# Patient Record
Sex: Male | Born: 1935 | Race: White | Hispanic: No | Smoking: Former smoker
Health system: Southern US, Community
[De-identification: ages and names within clinical notes are randomized; demographics above are authoritative.]

## PROBLEM LIST (undated history)

## (undated) DIAGNOSIS — K635 Polyp of colon: Secondary | ICD-10-CM

## (undated) DIAGNOSIS — M1990 Unspecified osteoarthritis, unspecified site: Secondary | ICD-10-CM

## (undated) DIAGNOSIS — Z87891 Personal history of nicotine dependence: Secondary | ICD-10-CM

## (undated) DIAGNOSIS — J449 Chronic obstructive pulmonary disease, unspecified: Secondary | ICD-10-CM

## (undated) DIAGNOSIS — L219 Seborrheic dermatitis, unspecified: Secondary | ICD-10-CM

## (undated) DIAGNOSIS — N400 Enlarged prostate without lower urinary tract symptoms: Secondary | ICD-10-CM

## (undated) DIAGNOSIS — D696 Thrombocytopenia, unspecified: Secondary | ICD-10-CM

## (undated) DIAGNOSIS — H269 Unspecified cataract: Secondary | ICD-10-CM

## (undated) DIAGNOSIS — C679 Malignant neoplasm of bladder, unspecified: Secondary | ICD-10-CM

## (undated) DIAGNOSIS — E119 Type 2 diabetes mellitus without complications: Secondary | ICD-10-CM

## (undated) DIAGNOSIS — E785 Hyperlipidemia, unspecified: Secondary | ICD-10-CM

## (undated) DIAGNOSIS — IMO0002 Reserved for concepts with insufficient information to code with codable children: Secondary | ICD-10-CM

## (undated) DIAGNOSIS — K219 Gastro-esophageal reflux disease without esophagitis: Secondary | ICD-10-CM

## (undated) DIAGNOSIS — H3320 Serous retinal detachment, unspecified eye: Secondary | ICD-10-CM

## (undated) DIAGNOSIS — L570 Actinic keratosis: Secondary | ICD-10-CM

## (undated) DIAGNOSIS — M109 Gout, unspecified: Secondary | ICD-10-CM

## (undated) DIAGNOSIS — I10 Essential (primary) hypertension: Secondary | ICD-10-CM

## (undated) DIAGNOSIS — G4700 Insomnia, unspecified: Secondary | ICD-10-CM

## (undated) HISTORY — DX: Polyp of colon: K63.5

## (undated) HISTORY — DX: Unspecified cataract: H26.9

## (undated) HISTORY — DX: Unspecified osteoarthritis, unspecified site: M19.90

## (undated) HISTORY — DX: Thrombocytopenia, unspecified: D69.6

## (undated) HISTORY — DX: Benign prostatic hyperplasia without lower urinary tract symptoms: N40.0

## (undated) HISTORY — DX: Essential (primary) hypertension: I10

## (undated) HISTORY — DX: Chronic obstructive pulmonary disease, unspecified: J44.9

## (undated) HISTORY — DX: Reserved for concepts with insufficient information to code with codable children: IMO0002

## (undated) HISTORY — DX: Hyperlipidemia, unspecified: E78.5

## (undated) HISTORY — DX: Insomnia, unspecified: G47.00

## (undated) HISTORY — DX: Gout, unspecified: M10.9

## (undated) HISTORY — PX: HERNIA REPAIR: SHX51

## (undated) HISTORY — DX: Serous retinal detachment, unspecified eye: H33.20

## (undated) HISTORY — DX: Type 2 diabetes mellitus without complications: E11.9

## (undated) HISTORY — DX: Seborrheic dermatitis, unspecified: L21.9

## (undated) HISTORY — DX: Malignant neoplasm of bladder, unspecified: C67.9

## (undated) HISTORY — DX: Actinic keratosis: L57.0

## (undated) HISTORY — DX: Personal history of nicotine dependence: Z87.891

## (undated) HISTORY — PX: OTHER SURGICAL HISTORY: SHX169

## (undated) HISTORY — DX: Gastro-esophageal reflux disease without esophagitis: K21.9

---

## 2003-01-25 DIAGNOSIS — M159 Polyosteoarthritis, unspecified: Secondary | ICD-10-CM | POA: Insufficient documentation

## 2003-05-30 ENCOUNTER — Ambulatory Visit (HOSPITAL_COMMUNITY): Admission: RE | Admit: 2003-05-30 | Discharge: 2003-05-31 | Payer: Self-pay | Admitting: Ophthalmology

## 2003-06-13 ENCOUNTER — Ambulatory Visit (HOSPITAL_COMMUNITY): Admission: RE | Admit: 2003-06-13 | Discharge: 2003-06-14 | Payer: Self-pay | Admitting: Ophthalmology

## 2004-01-29 ENCOUNTER — Ambulatory Visit (HOSPITAL_COMMUNITY): Admission: RE | Admit: 2004-01-29 | Discharge: 2004-01-29 | Payer: Self-pay | Admitting: Ophthalmology

## 2011-03-02 DIAGNOSIS — L570 Actinic keratosis: Secondary | ICD-10-CM | POA: Insufficient documentation

## 2011-06-01 DIAGNOSIS — C679 Malignant neoplasm of bladder, unspecified: Secondary | ICD-10-CM | POA: Diagnosis present

## 2011-12-16 DIAGNOSIS — L219 Seborrheic dermatitis, unspecified: Secondary | ICD-10-CM | POA: Insufficient documentation

## 2012-11-20 DIAGNOSIS — D126 Benign neoplasm of colon, unspecified: Secondary | ICD-10-CM | POA: Insufficient documentation

## 2014-01-09 ENCOUNTER — Emergency Department: Payer: Self-pay | Admitting: Emergency Medicine

## 2014-05-27 DIAGNOSIS — F5101 Primary insomnia: Secondary | ICD-10-CM | POA: Insufficient documentation

## 2015-02-10 DIAGNOSIS — M109 Gout, unspecified: Secondary | ICD-10-CM | POA: Diagnosis present

## 2015-02-10 DIAGNOSIS — K219 Gastro-esophageal reflux disease without esophagitis: Secondary | ICD-10-CM | POA: Diagnosis present

## 2015-03-25 ENCOUNTER — Emergency Department: Payer: Medicare HMO

## 2015-03-25 ENCOUNTER — Emergency Department
Admission: EM | Admit: 2015-03-25 | Discharge: 2015-03-26 | Disposition: A | Discharge status: Home / Self Care | Payer: Medicare HMO | Attending: Emergency Medicine | Admitting: Emergency Medicine

## 2015-03-25 ENCOUNTER — Encounter: Payer: Self-pay | Admitting: Emergency Medicine

## 2015-03-25 DIAGNOSIS — J209 Acute bronchitis, unspecified: Secondary | ICD-10-CM | POA: Insufficient documentation

## 2015-03-25 DIAGNOSIS — E119 Type 2 diabetes mellitus without complications: Secondary | ICD-10-CM | POA: Diagnosis not present

## 2015-03-25 DIAGNOSIS — R61 Generalized hyperhidrosis: Secondary | ICD-10-CM | POA: Insufficient documentation

## 2015-03-25 DIAGNOSIS — Z791 Long term (current) use of non-steroidal anti-inflammatories (NSAID): Secondary | ICD-10-CM | POA: Diagnosis not present

## 2015-03-25 DIAGNOSIS — R05 Cough: Secondary | ICD-10-CM | POA: Diagnosis present

## 2015-03-25 DIAGNOSIS — Z7982 Long term (current) use of aspirin: Secondary | ICD-10-CM | POA: Diagnosis not present

## 2015-03-25 DIAGNOSIS — Z880 Allergy status to penicillin: Secondary | ICD-10-CM | POA: Insufficient documentation

## 2015-03-25 DIAGNOSIS — Z79899 Other long term (current) drug therapy: Secondary | ICD-10-CM | POA: Diagnosis not present

## 2015-03-25 DIAGNOSIS — Z87891 Personal history of nicotine dependence: Secondary | ICD-10-CM | POA: Insufficient documentation

## 2015-03-25 DIAGNOSIS — R531 Weakness: Secondary | ICD-10-CM

## 2015-03-25 DIAGNOSIS — I10 Essential (primary) hypertension: Secondary | ICD-10-CM | POA: Insufficient documentation

## 2015-03-25 DIAGNOSIS — J40 Bronchitis, not specified as acute or chronic: Secondary | ICD-10-CM

## 2015-03-25 LAB — URINALYSIS COMPLETE WITH MICROSCOPIC (ARMC ONLY)
Bacteria, UA: NONE SEEN
Bilirubin Urine: NEGATIVE
Glucose, UA: NEGATIVE mg/dL
Hgb urine dipstick: NEGATIVE
Ketones, ur: NEGATIVE mg/dL
Leukocytes, UA: NEGATIVE
NITRITE: NEGATIVE
Protein, ur: NEGATIVE mg/dL
RBC / HPF: NONE SEEN RBC/hpf (ref 0–5)
Specific Gravity, Urine: 1.012 (ref 1.005–1.030)
pH: 5 (ref 5.0–8.0)

## 2015-03-25 LAB — CBC
HCT: 38.4 % — ABNORMAL LOW (ref 40.0–52.0)
Hemoglobin: 12.7 g/dL — ABNORMAL LOW (ref 13.0–18.0)
MCH: 31.2 pg (ref 26.0–34.0)
MCHC: 33 g/dL (ref 32.0–36.0)
MCV: 94.5 fL (ref 80.0–100.0)
Platelets: 116 10*3/uL — ABNORMAL LOW (ref 150–440)
RBC: 4.07 MIL/uL — ABNORMAL LOW (ref 4.40–5.90)
RDW: 14.5 % (ref 11.5–14.5)
WBC: 12.8 10*3/uL — ABNORMAL HIGH (ref 3.8–10.6)

## 2015-03-25 LAB — BASIC METABOLIC PANEL
Anion gap: 9 (ref 5–15)
BUN: 24 mg/dL — AB (ref 6–20)
CALCIUM: 8.9 mg/dL (ref 8.9–10.3)
CO2: 29 mmol/L (ref 22–32)
Chloride: 102 mmol/L (ref 101–111)
Creatinine, Ser: 1.02 mg/dL (ref 0.61–1.24)
GFR calc Af Amer: >60 mL/min (ref >60)
GFR calc non Af Amer: >60 mL/min (ref >60)
GLUCOSE: 145 mg/dL — AB (ref 65–99)
Potassium: 4 mmol/L (ref 3.5–5.1)
Sodium: 140 mmol/L (ref 135–145)

## 2015-03-25 LAB — TROPONIN I: Troponin I: <0.03 ng/mL (ref <0.031)

## 2015-03-25 MED ORDER — IPRATROPIUM-ALBUTEROL 0.5-2.5 (3) MG/3ML IN SOLN
3.0000 mL | Freq: Once | RESPIRATORY_TRACT | Status: AC
  Administered 2015-03-25: 3 mL via RESPIRATORY_TRACT
  Filled 2015-03-25: qty 3

## 2015-03-25 MED ORDER — AZITHROMYCIN 250 MG PO TABS: ORAL_TABLET | ORAL | Status: AC

## 2015-03-25 NOTE — ED Notes (Signed)
Pt uprite on stretcher in exam room with no distress noted, watching TV; family member at bedside; pt denies c/o at present, stating that he feels better now than initial presentation; pt & family updated on next plan of care & both voice understanding

## 2015-03-25 NOTE — ED Provider Notes (Signed)
[facility] Emergency Department Provider Note  Time seen: 11:22 PM  I have reviewed the triage vital signs and the nursing notes.   HISTORY  Chief Complaint Weakness    HPI Jared Parker is a 81 y.o. male with a past medical history of diabetes, hypertension, presents the emergency department with shaking. According to the patient he got up to use the bathroom, and began shaking. Family states he got very pale and a little sweaty as well. Patient denies any chest pain now or at any time. Denies abdominal pain and nausea or trouble breathing. States he took his blood sugar and it was 102. He states upon arrival to the emergency department all the symptoms have resolved and he feels normal. States he has had a mild cough at home for the past 2 weeks which is not getting better. Denies any complaints at this time.    Past Medical History  Diagnosis Date  . Diabetes mellitus without complication ([place])   . Hypertension   . Cancer ([place])     There are no active problems to display for this patient.   Past Surgical History  Procedure Laterality Date  . Bladder cancer      Per pt nine surgeries    Current Outpatient Rx  Name  Route  Sig  Dispense  Refill  . allopurinol (ZYLOPRIM) 100 MG tablet   Oral   Take 2 tablets by mouth 2 (two) times daily.         Marland Kitchen aspirin EC 81 MG tablet   Oral   Take 81 mg by mouth daily.         . [place]-Glucosamine-Vit D (GLUCOSAMINE COMPLEX PO)   Oral   Take 1 capsule by mouth daily.         Marland Kitchen docusate sodium (COLACE) 100 MG capsule   Oral   Take 100 mg by mouth 2 (two) times daily.         . dorzolamide (TRUSOPT) 2 % ophthalmic solution   Both Eyes   Place 1 drop into both eyes 3 (three) times daily.         Marland Kitchen etodolac (LODINE) 200 MG capsule   Oral   Take 1 capsule by mouth daily.         . finasteride (PROSCAR) 5 MG tablet   Oral   Take 1 tablet by mouth daily.         .  hydrochlorothiazide (HYDRODIURIL) 25 MG tablet   Oral   Take 1 tablet by mouth daily.         Marland Kitchen lisinopril (PRINIVIL,ZESTRIL) 10 MG tablet   Oral   Take 1 tablet by mouth daily.         . Omega-3 Fatty Acids (FISH OIL) 1000 MG CAPS   Oral   Take 1 capsule by mouth daily.         Marland Kitchen omeprazole (PRILOSEC) 20 MG capsule   Oral   Take 1 capsule by mouth daily.         Marland Kitchen oxyCODONE (OXY IR/ROXICODONE) 5 MG immediate release tablet   Oral   Take 5-10 mg by mouth 4 (four) times daily as needed.         . simvastatin (ZOCOR) 10 MG tablet   Oral   Take 10 mg by mouth daily.         Marland Kitchen SPIRIVA HANDIHALER 18 MCG inhalation capsule   Inhalation   Place 1 capsule into inhaler and  inhale daily.           Dispense as written.   . tamsulosin (FLOMAX) 0.4 MG CAPS capsule   Oral   Take 0.4 mg by mouth 2 (two) times daily.         . traMADol (ULTRAM) 50 MG tablet   Oral   Take 1 tablet by mouth 3 (three) times daily as needed.           Allergies Ciprofloxacin; Amoxicillin; and Penicillins  No family history on file.  Social History Social History  Substance Use Topics  . Smoking status: Former Smoker    Quit date: 03/24/1990  . Smokeless tobacco: None  . Alcohol Use: No    Review of Systems Constitutional: Negative for fever. Cardiovascular: Negative for chest pain. Respiratory: Negative for shortness of breath. Intermittent cough. Gastrointestinal: Negative for abdominal pain, vomiting and diarrhea. Genitourinary: Negative for dysuria. Neurological: Negative for headache 10-point ROS otherwise negative.  ____________________________________________   PHYSICAL EXAM:  VITAL SIGNS: ED Triage Vitals  Enc Vitals Group     BP 03/25/15 2139 137/100 mmHg     Pulse Rate 03/25/15 2139 98     Resp --      Temp 03/25/15 2139 98.4 F (36.9 C)     Temp Source 03/25/15 2139 Oral     SpO2 03/25/15 2134 95 %     Weight 03/25/15 2139 165 lb (74.844 kg)      Height 03/25/15 2139 5\' 11"  (1.803 m)     Head Cir --      Peak Flow --      Pain Score 03/25/15 2141 0     Pain Loc --      Pain Edu? --      Excl. in [place]? --     Constitutional: Alert and oriented. Well appearing and in no distress. Eyes: Normal exam ENT   Head: Normocephalic and atraumatic.   Mouth/Throat: Mucous membranes are moist. Cardiovascular: Normal rate, regular rhythm. No murmurs, rubs, or gallops. Respiratory: Normal respiratory effort without tachypnea nor retractions. Slight expiratory wheeze right greater than left. Gastrointestinal: Soft and nontender. No distention.   Musculoskeletal: Nontender with normal range of motion in all extremities.  Neurologic:  Normal speech and language. No gross focal neurologic deficits Skin:  Skin is warm, dry and intact.  Psychiatric: Mood and affect are normal. Speech and behavior are normal.   ____________________________________________    EKG  KG reviewed and interpreted by myself shows sinus rhythm at 99 bpm. Narrow QRS, normal axis, left skull interference on EKG however no ST elevations noted.  ____________________________________________    RADIOLOGY  Chest x-ray shows no acute abnormality  ____________________________________________    INITIAL IMPRESSION / ASSESSMENT AND PLAN / ED COURSE  Pertinent labs & imaging results that were available during my care of the patient were reviewed by me and considered in my medical decision making (see chart for details).  Patient with acute onset of weakness and shaking, now feels better with no medical complaints at this time. Patient does have a slight wheeze, right greater than left along with persisting cough. However chest x-ray does not show any acute pneumonia. Labs are largely within normal limits besides a mild leukocytosis. Troponin negative. We will repeat a troponin at 3 hours to help rule out a cardiac cause of his symptoms. Otherwise we will treat the  patient with Zithromax for possible bronchitis, and I have also discussed with the patient following up with a cardiologist for  Holter testing. Patient is agreeable to plan.  Patient care signed out to Dr. Beather Arbour.  ____________________________________________   FINAL CLINICAL IMPRESSION(S) / ED DIAGNOSES  Weakness   Harvest Dark, MD 03/25/15 [phone]

## 2015-03-25 NOTE — ED Notes (Signed)
Per [facility] pt was going to the BR tonight and felt shaky. Per EMS pt was pale and diaphoretic. Pt denied LOC, falling. Pt is alert an oriented at this time.

## 2015-03-25 NOTE — Discharge Instructions (Signed)
Has been seen in the emergency department today for weakness and cough. Your workup is most consistent with acute bronchitis. Please take your antibiotic as prescribed, parts entire course. Please follow-up your primary care physician as soon as possible to discuss your symptoms, as well as to discuss possible Holter monitor testing. You may also speak with our local cardiologist by calling the number provided to arrange a Holter monitor test. Return to the emergency department for any further episodes of weakness, any chest pain, trouble breathing, or any other symptom personally concerning to yourself.     Weakness Weakness is a lack of strength. It may be felt all over the body (generalized) or in one specific part of the body (focal). Some causes of weakness can be serious. You may need further medical evaluation, especially if you are elderly or you have a history of immunosuppression (such as chemotherapy or HIV), kidney disease, heart disease, or diabetes. CAUSES  Weakness can be caused by many different things, including:  Infection.  Physical exhaustion.  Internal bleeding or other blood loss that results in a lack of red blood cells (anemia).  Dehydration. This cause is more common in elderly people.  Side effects or electrolyte abnormalities from medicines, such as pain medicines or sedatives.  Emotional distress, anxiety, or depression.  Circulation problems, especially severe peripheral arterial disease.  Heart disease, such as rapid atrial fibrillation, bradycardia, or heart failure.  Nervous system disorders, such as Guillain-Barr syndrome, multiple sclerosis, or stroke. DIAGNOSIS  To find the cause of your weakness, your caregiver will take your history and perform a physical exam. Lab tests or X-rays may also be ordered, if needed. TREATMENT  Treatment of weakness depends on the cause of your symptoms and can vary greatly. HOME CARE INSTRUCTIONS   Rest as  needed.  Eat a well-balanced diet.  Try to get some exercise every day.  Only take over-the-counter or prescription medicines as directed by your caregiver. SEEK MEDICAL CARE IF:   Your weakness seems to be getting worse or spreads to other parts of your body.  You develop new aches or pains. SEEK IMMEDIATE MEDICAL CARE IF:   You cannot perform your normal daily activities, such as getting dressed and feeding yourself.  You cannot walk up and down stairs, or you feel exhausted when you do so.  You have shortness of breath or chest pain.  You have difficulty moving parts of your body.  You have weakness in only one area of the body or on only one side of the body.  You have a fever.  You have trouble speaking or swallowing.  You cannot control your bladder or bowel movements.  You have black or bloody vomit or stools. MAKE SURE YOU:  Understand these instructions.  Will watch your condition.  Will get help right away if you are not doing well or get worse.   This information is not intended to replace advice given to you by your health care provider. Make sure you discuss any questions you have with your health care provider.   Document Released: 05/03/2005 Document Revised: 11/02/2011 Document Reviewed: 07/02/2011 Elsevier Interactive Patient Education [employer].

## 2015-03-26 ENCOUNTER — Telehealth: Payer: Self-pay | Admitting: *Deleted

## 2015-03-26 DIAGNOSIS — J209 Acute bronchitis, unspecified: Secondary | ICD-10-CM | POA: Diagnosis not present

## 2015-03-26 LAB — TROPONIN I: Troponin I: <0.03 ng/mL (ref <0.031)

## 2015-03-26 NOTE — Telephone Encounter (Signed)
lmov to schedule ED fu

## 2015-03-26 NOTE — ED Provider Notes (Signed)
-----------------------------------------   1:14 AM on 03/26/2015 -----------------------------------------  Patient resting in no acute distress. Updated patient and family member of repeat negative troponin. Strict return precautions given. Both verbalize understanding and agree with plan of care for antibiotics for bronchitis, and outpatient cardiology follow-up for Holter.  Paulette Blanch, MD 03/26/15 [phone]

## 2015-04-28 ENCOUNTER — Ambulatory Visit (INDEPENDENT_AMBULATORY_CARE_PROVIDER_SITE_OTHER): Payer: Medicare HMO | Admitting: Nurse Practitioner

## 2015-04-28 ENCOUNTER — Encounter: Payer: Self-pay | Admitting: Nurse Practitioner

## 2015-04-28 VITALS — BP 110/50 | HR 59 | Ht 71.0 in | Wt 172.0 lb

## 2015-04-28 DIAGNOSIS — E119 Type 2 diabetes mellitus without complications: Secondary | ICD-10-CM | POA: Diagnosis not present

## 2015-04-28 DIAGNOSIS — R251 Tremor, unspecified: Secondary | ICD-10-CM

## 2015-04-28 DIAGNOSIS — E785 Hyperlipidemia, unspecified: Secondary | ICD-10-CM | POA: Insufficient documentation

## 2015-04-28 DIAGNOSIS — IMO0001 Reserved for inherently not codable concepts without codable children: Secondary | ICD-10-CM

## 2015-04-28 DIAGNOSIS — I10 Essential (primary) hypertension: Secondary | ICD-10-CM | POA: Insufficient documentation

## 2015-04-28 NOTE — Patient Instructions (Signed)
Medication Instructions:  Your physician recommends that you continue on your current medications as directed. Please refer to the Current Medication list given to you today.   Labwork: none  Testing/Procedures: None.  Follow-Up: Your physician recommends that you schedule a follow-up appointment as needed.    Any Other Special Instructions Will Be Listed Below (If Applicable).     If you need a refill on your cardiac medications before your next appointment, please call your pharmacy.   

## 2015-04-28 NOTE — Progress Notes (Signed)
Patient Name: Jared Parker Date of Encounter: 04/28/2015  Primary Care Provider:  Pcp Not In System Primary Cardiologist:  new  Chief Complaint  81 y/o male with a h/o HTN, HL, DM, and bladder cancer who was referred to cardiology secondary to shaking spells.  Past Medical History   Past Medical History  Diagnosis Date  . Type II diabetes mellitus ([place])   . Essential hypertension   . Hyperlipidemia   . History of tobacco abuse   . COPD (chronic obstructive pulmonary disease) ([place])   . GERD (gastroesophageal reflux disease)   . Gout   . Osteoarthritis   . Bladder cancer ([place])     a. DX 1998 w/ recurrences in 2000, 2001, 2002, 2006, 12/2010, 06/2011, 12/2012, & 08/2013;  b. 06/2011 s/p TURBT;  c. Quarterly cystoscopies @ [facility], last 01/2015 ->nl.  . Squamous cell carcinoma ([place])   . Actinic keratosis   . Seborrheic dermatitis   . Colon polyps   . Insomnia   . Thrombocytopenia ([place])   . Cataracts, bilateral   . Retinal detachment   . Prostate hypertrophy    Past Surgical History  Procedure Laterality Date  . Bladder cancer      Per pt nine surgeries  . Hernia repair      Allergies  Allergies  Allergen Reactions  . Cephalosporins Hives  . Ciprofloxacin Hives, Shortness Of Breath and Rash  . Doxycycline Hives  . Streptomycin Hives  . Sulfa Antibiotics Hives  . Amoxicillin Hives  . Atenolol     Other reaction(s): Other (See Comments) Slowed his heart rate.  . Penicillins Hives  . Hydroxychloroquine Rash    Other reaction(s): Other (See Comments) unknown unknown Other reaction(s): Other (See Comments) unknown    HPI  81 y/o male with a h/o HTN, HL, DM (now diet controlled), remote tobacco abuse, and bladder cancer s/p multiple surgeries.  He has no prior cardiac hx and no family h/o CAD that he is aware of.  He lives by himself locally and is very active, still push mowing his own yard.  He notes that he can usually do this for about 15 minutes prior to  taking a break due to fatigue or mild dyspnea, though it's been that way for many years.  He has never had chest pain.    Sometime late in the summer he was at home and developed shaking of his arms, hands, legs, and feet.  He was otw asymptomatic.  This persisted for about 10 mins and resolved spontaneously.  His blood sugar was 101, which is where he typically runs.  He did not seek medical attention.  On 11/8, he was at his brother's house and watching the election coverage when he had sudden onset of somewhat violent shaking of his arms, hands, feet, and legs. He was otw asymptomatic and denies fevers, chills, alteration in mental status, chest pain, sob, presyncope/syncope, n, v.  Unfortunately, shaking persisted and his brother called EMS.  His blood glucose was 106.  Per pt, VS were stable upon EMS arrival.  He was taken to the [facility] ED where ECG was non-acute (artifact noted - likely 2/2 shaking).  CXR showed probably small calcified granuloma in the right lung.  WBC 12.8.  He was given a Z pack for presumptive bronchitis.  ER note says that family reported that he was pale and mildly diaphoretic however pt says that that was not the case and reiterates that he was totally asymptomatic outside of  shaking.  He was advised by ER staff to f/u with cardiology.  He has had no recurrence of shaking.  Home Medications  Prior to Admission medications   Medication Sig Start Date End Date Taking? Authorizing Provider  allopurinol (ZYLOPRIM) 100 MG tablet Take 2 tablets by mouth 2 (two) times daily.   Yes Historical Provider, MD  aspirin EC 81 MG tablet Take 81 mg by mouth daily.   Yes Historical Provider, MD  docusate sodium (COLACE) 100 MG capsule Take 100 mg by mouth as needed.    Yes Historical Provider, MD  dorzolamide (TRUSOPT) 2 % ophthalmic solution Place 1 drop into both eyes 3 (three) times daily.   Yes Historical Provider, MD  hydrochlorothiazide (HYDRODIURIL) 25 MG tablet Take 1 tablet by mouth  daily.   Yes Historical Provider, MD  ketoconazole (NIZORAL) 2 % cream Apply 1 application topically as needed for irritation.   Yes Historical Provider, MD  ketoconazole (NIZORAL) 2 % shampoo Apply 1 application topically as needed for irritation.   Yes Historical Provider, MD  lisinopril (PRINIVIL,ZESTRIL) 10 MG tablet Take 1 tablet by mouth daily.   Yes Historical Provider, MD  metroNIDAZOLE (METROCREAM) 0.75 % cream Apply topically as needed.   Yes Historical Provider, MD  Omega-3 Fatty Acids (FISH OIL) 1000 MG CAPS Take 1 capsule by mouth daily.   Yes Historical Provider, MD  omeprazole (PRILOSEC) 20 MG capsule Take 1 capsule by mouth daily.   Yes Historical Provider, MD  oxyCODONE (OXY IR/ROXICODONE) 5 MG immediate release tablet Take 5-10 mg by mouth 4 (four) times daily as needed.   Yes Historical Provider, MD  simvastatin (ZOCOR) 10 MG tablet Take 10 mg by mouth daily.   Yes Historical Provider, MD  SPIRIVA HANDIHALER 18 MCG inhalation capsule Place 1 capsule into inhaler and inhale daily.   Yes Historical Provider, MD  tamsulosin (FLOMAX) 0.4 MG CAPS capsule Take 0.4 mg by mouth 2 (two) times daily.   Yes Historical Provider, MD  traMADol (ULTRAM) 50 MG tablet Take 1 tablet by mouth as needed.    Yes Historical Provider, MD    Family History  Family History  Problem Relation Age of Onset  . Diabetes Mother     died @ 43  . Kidney failure Mother   . Other Father     died @ 73 of black lung  . Cancer Brother   . Diabetes Brother   . Other      two sisters - A & W.    Social History  Social History   Social History  . Marital Status: Married    Spouse Name: N/A  . Number of Children: N/A  . Years of Education: N/A   Occupational History  . Not on file.   Social History Main Topics  . Smoking status: Former Smoker -- 2.00 packs/day for 40 years    Types: Cigarettes    Quit date: 03/24/1990  . Smokeless tobacco: Not on file  . Alcohol Use: No  . Drug Use: No  .  Sexual Activity: No   Other Topics Concern  . Not on file   Social History Narrative   Lives in [place] by himself.  Retired heavy Company secretary.  Very active.  Still push mows his own yard.     Review of Systems  General:  2 shaking spells (Aug/Nov).  No chills, fever, night sweats or weight changes.  Cardiovascular:  No chest pain, Occas dyspnea w/ higher levels of exertion like  mowing his lawn.  No edema, orthopnea, palpitations, paroxysmal nocturnal dyspnea. Dermatological: No rash, lesions/masses Respiratory: No cough, Ex dyspnea as noted above. Urologic: No hematuria, dysuria Abdominal:   No nausea, vomiting, diarrhea, bright red blood per rectum, melena, or hematemesis Neurologic:  No visual changes, wkns, changes in mental status. All other systems reviewed and are otherwise negative except as noted above.  Physical Exam  VS:  BP 110/50 mmHg  Pulse 59  Ht 5\' 11"  (1.803 m)  Wt 172 lb (78.019 kg)  BMI 24.00 kg/m2 , BMI Body mass index is 24 kg/(m^2). GEN: Well nourished, well developed, in no acute distress. HEENT: normal. Neck: Supple, no JVD, carotid bruits, or masses. Cardiac: RRR, distant.  No murmurs, rubs, or gallops. No clubbing, cyanosis, edema.  Radials/DP/PT 2+ and equal bilaterally.  Respiratory:  Respirations regular and unlabored, clear to auscultation bilaterally. GI: Soft, nontender, nondistended, BS + x 4. MS: no deformity or atrophy. Skin: warm and dry, no rash. Neuro:  Strength and sensation are intact. Psych: Normal affect.  Accessory Clinical Findings  ECG - SB, 59, PAC's, no acute st/t changes.  Assessment & Plan  1.  Shaking Spells:  Pt was seen in the [facility] ED on 11/8 due to involuntary shaking in the absence of fever, chills, chest pain, dyspnea, n, v, dizziness/LH/presyncope, or change in mental status.  His BG was nl @ home and ER findings notable only for mild leukocytosis and ? Bronchitis on CXR, for which he was treated.  He has  had no recurrence of Ss.  He was referred to [place] for consideration of event monitoring though notably, he was still shaking while being monitored in the ED and his 12 lead from that night reflects artifact likely consistent with shaking (RSR).  Though he does experience DOE with higher levels of activity, like 15 mins of push mowing his yard, he otw does well and has no h/o ex c/p, palpitations, presyncope, or syncope.  We discussed the potential role of stress testing in the setting of DOE, but he feels that his Ss have been stable for several years and also notes that it takes a fair amt of activity to make him dyspneic.  As such, I will defer any further w/u @ this time.  I don't see an indication for event monitoring.  I've asked him to contact us if he develops progression of DOE.  He may require neurologic eval for recurrent shaking spells.  2. Essential HTN:  Stable on acei and HCTZ therapy.  3.  HL:  On statin.  Followed by his PCP.  4.  DM II:  Now diet controlled after losing wt and coming off meds.  Sugars @ home are typically in the low 100's.  5.  Dispo:  F/u as needed.  Murray Hodgkins, NP 04/28/2015, 3:58 PM

## 2015-04-29 ENCOUNTER — Ambulatory Visit: Payer: Commercial Managed Care - HMO | Admitting: Nurse Practitioner

## 2016-01-16 IMAGING — CR DG CHEST 2V
1 series · 3 of 3 positions shown · non-contrast
Comparison: None available.

CLINICAL DATA: Pale and diaphoretic tonight.

EXAM:
CHEST  2 VIEW

[Series 1: dg chest 2 view · 0.14mm/px · 3 of 3 slices shown]
[im 1/3]
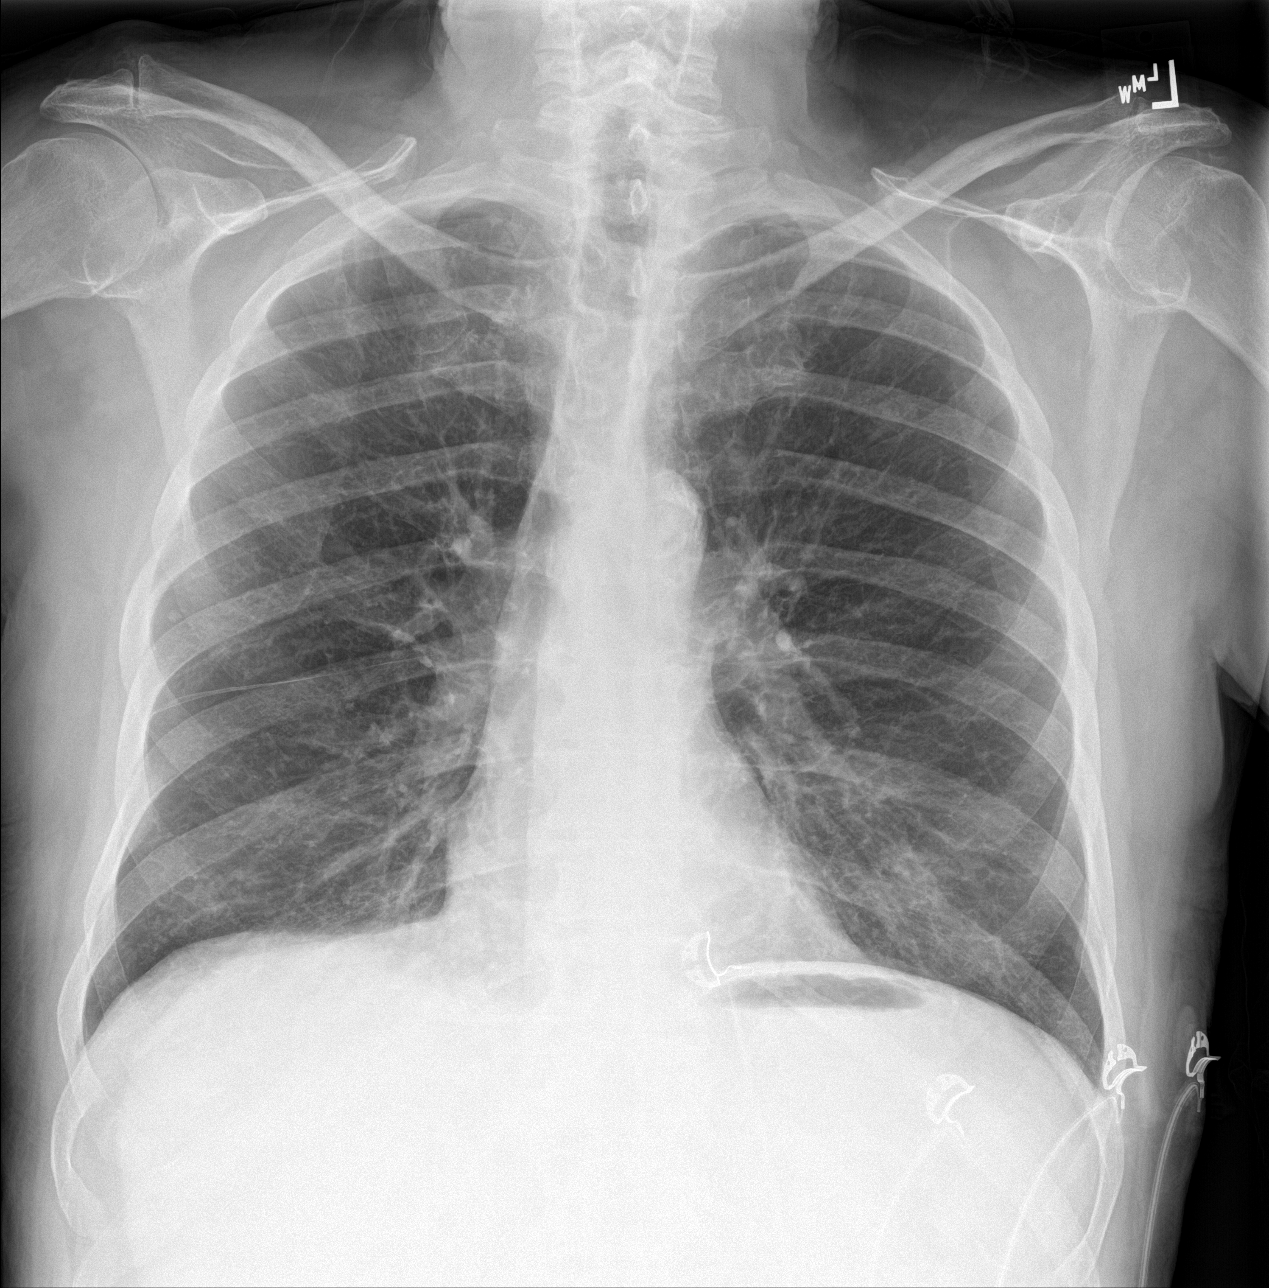
[im 2/3]
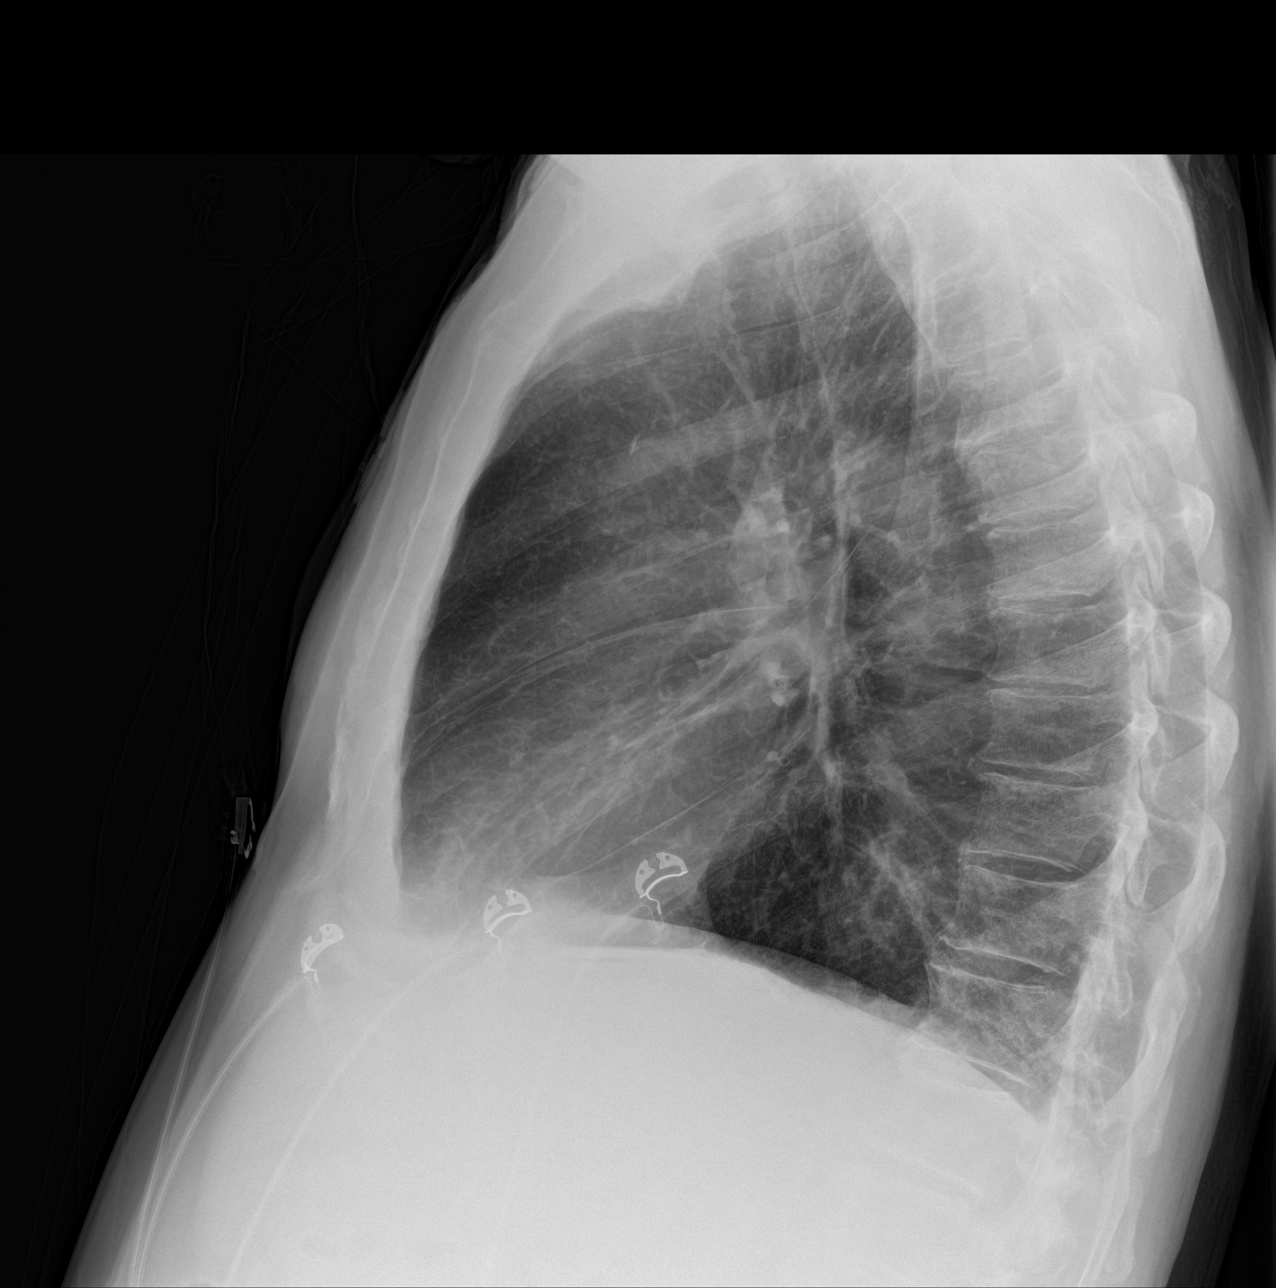
[im 3/3]
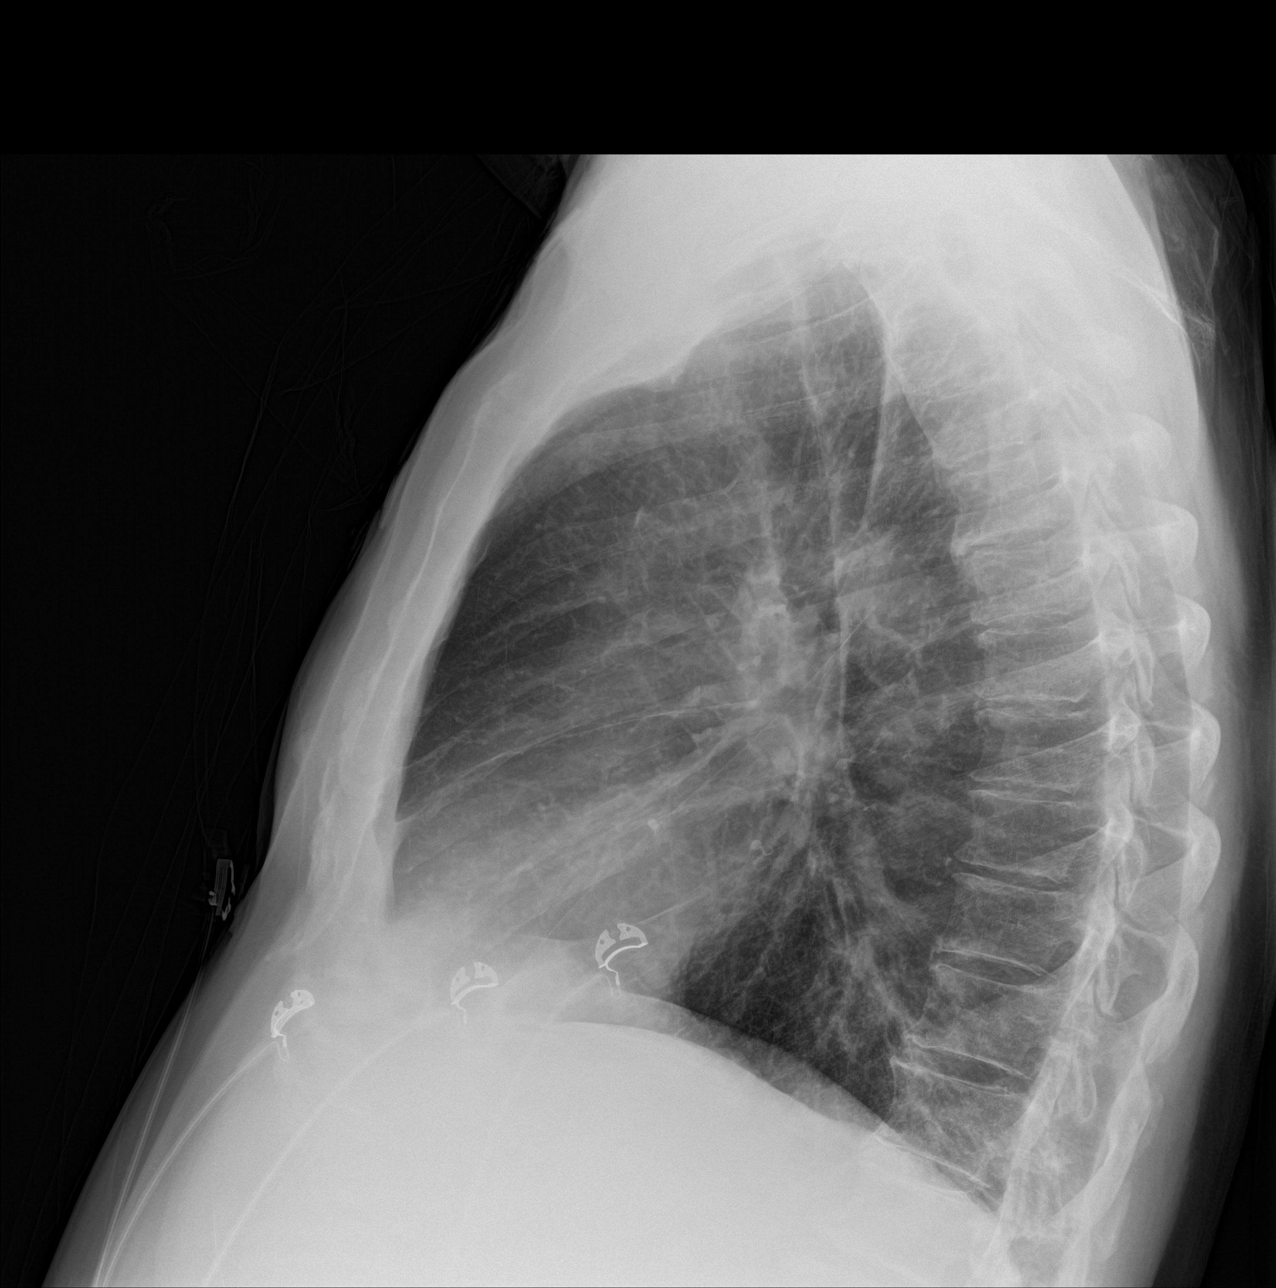

[3 of 3 positions shown; findings below may reference images not displayed]

FINDINGS: The cardiac silhouette, mediastinal and hilar contours are normal.
There is tortuosity and calcification of the thoracic aorta. The
lungs are clear of acute process. There is a rounded density in the
right mid lateral lung which is most likely a small calcified
granuloma. No pleural effusions. Artifact from EKG leads are noted.
The bony thorax is intact.
IMPRESSION: No acute cardiopulmonary findings.

Probable small calcified granuloma in the right lung laterally.

## 2016-11-19 DIAGNOSIS — H9190 Unspecified hearing loss, unspecified ear: Secondary | ICD-10-CM | POA: Insufficient documentation

## 2017-02-16 DIAGNOSIS — Z8614 Personal history of Methicillin resistant Staphylococcus aureus infection: Secondary | ICD-10-CM | POA: Insufficient documentation

## 2017-12-09 DIAGNOSIS — Z789 Other specified health status: Secondary | ICD-10-CM | POA: Insufficient documentation

## 2019-12-20 ENCOUNTER — Emergency Department: Payer: Medicare HMO

## 2019-12-20 ENCOUNTER — Other Ambulatory Visit: Payer: Self-pay

## 2019-12-20 ENCOUNTER — Inpatient Hospital Stay
Admission: EM | Admit: 2019-12-20 | Discharge: 2019-12-25 | DRG: 177 | Disposition: A | Discharge status: Home Health | Payer: Medicare HMO | Attending: Internal Medicine | Admitting: Internal Medicine

## 2019-12-20 ENCOUNTER — Encounter: Payer: Self-pay | Admitting: Emergency Medicine

## 2019-12-20 DIAGNOSIS — K219 Gastro-esophageal reflux disease without esophagitis: Secondary | ICD-10-CM | POA: Diagnosis present

## 2019-12-20 DIAGNOSIS — I129 Hypertensive chronic kidney disease with stage 1 through stage 4 chronic kidney disease, or unspecified chronic kidney disease: Secondary | ICD-10-CM | POA: Diagnosis present

## 2019-12-20 DIAGNOSIS — Z888 Allergy status to other drugs, medicaments and biological substances status: Secondary | ICD-10-CM

## 2019-12-20 DIAGNOSIS — Z882 Allergy status to sulfonamides status: Secondary | ICD-10-CM | POA: Diagnosis not present

## 2019-12-20 DIAGNOSIS — Z841 Family history of disorders of kidney and ureter: Secondary | ICD-10-CM

## 2019-12-20 DIAGNOSIS — Z8719 Personal history of other diseases of the digestive system: Secondary | ICD-10-CM

## 2019-12-20 DIAGNOSIS — Z881 Allergy status to other antibiotic agents status: Secondary | ICD-10-CM

## 2019-12-20 DIAGNOSIS — J1282 Pneumonia due to coronavirus disease 2019: Secondary | ICD-10-CM | POA: Diagnosis present

## 2019-12-20 DIAGNOSIS — J9601 Acute respiratory failure with hypoxia: Secondary | ICD-10-CM | POA: Diagnosis present

## 2019-12-20 DIAGNOSIS — J440 Chronic obstructive pulmonary disease with acute lower respiratory infection: Secondary | ICD-10-CM | POA: Diagnosis present

## 2019-12-20 DIAGNOSIS — Z880 Allergy status to penicillin: Secondary | ICD-10-CM | POA: Diagnosis not present

## 2019-12-20 DIAGNOSIS — N1831 Chronic kidney disease, stage 3a: Secondary | ICD-10-CM | POA: Diagnosis present

## 2019-12-20 DIAGNOSIS — Z87891 Personal history of nicotine dependence: Secondary | ICD-10-CM | POA: Diagnosis not present

## 2019-12-20 DIAGNOSIS — M1990 Unspecified osteoarthritis, unspecified site: Secondary | ICD-10-CM | POA: Diagnosis present

## 2019-12-20 DIAGNOSIS — E1122 Type 2 diabetes mellitus with diabetic chronic kidney disease: Secondary | ICD-10-CM | POA: Diagnosis present

## 2019-12-20 DIAGNOSIS — E1136 Type 2 diabetes mellitus with diabetic cataract: Secondary | ICD-10-CM | POA: Diagnosis present

## 2019-12-20 DIAGNOSIS — R0902 Hypoxemia: Secondary | ICD-10-CM

## 2019-12-20 DIAGNOSIS — Z66 Do not resuscitate: Secondary | ICD-10-CM | POA: Diagnosis present

## 2019-12-20 DIAGNOSIS — N400 Enlarged prostate without lower urinary tract symptoms: Secondary | ICD-10-CM | POA: Diagnosis present

## 2019-12-20 DIAGNOSIS — E119 Type 2 diabetes mellitus without complications: Secondary | ICD-10-CM

## 2019-12-20 DIAGNOSIS — I10 Essential (primary) hypertension: Secondary | ICD-10-CM | POA: Diagnosis present

## 2019-12-20 DIAGNOSIS — J449 Chronic obstructive pulmonary disease, unspecified: Secondary | ICD-10-CM | POA: Diagnosis not present

## 2019-12-20 DIAGNOSIS — Z79899 Other long term (current) drug therapy: Secondary | ICD-10-CM | POA: Diagnosis not present

## 2019-12-20 DIAGNOSIS — U071 COVID-19: Principal | ICD-10-CM

## 2019-12-20 DIAGNOSIS — E785 Hyperlipidemia, unspecified: Secondary | ICD-10-CM | POA: Diagnosis present

## 2019-12-20 DIAGNOSIS — Z7189 Other specified counseling: Secondary | ICD-10-CM | POA: Diagnosis not present

## 2019-12-20 DIAGNOSIS — J9600 Acute respiratory failure, unspecified whether with hypoxia or hypercapnia: Secondary | ICD-10-CM

## 2019-12-20 DIAGNOSIS — Z833 Family history of diabetes mellitus: Secondary | ICD-10-CM

## 2019-12-20 DIAGNOSIS — M109 Gout, unspecified: Secondary | ICD-10-CM | POA: Diagnosis present

## 2019-12-20 DIAGNOSIS — Z7982 Long term (current) use of aspirin: Secondary | ICD-10-CM

## 2019-12-20 DIAGNOSIS — C679 Malignant neoplasm of bladder, unspecified: Secondary | ICD-10-CM | POA: Diagnosis present

## 2019-12-20 DIAGNOSIS — Z515 Encounter for palliative care: Secondary | ICD-10-CM | POA: Diagnosis not present

## 2019-12-20 DIAGNOSIS — J4489 Other specified chronic obstructive pulmonary disease: Secondary | ICD-10-CM

## 2019-12-20 DIAGNOSIS — E875 Hyperkalemia: Secondary | ICD-10-CM | POA: Diagnosis not present

## 2019-12-20 DIAGNOSIS — R110 Nausea: Secondary | ICD-10-CM | POA: Diagnosis present

## 2019-12-20 DIAGNOSIS — H269 Unspecified cataract: Secondary | ICD-10-CM | POA: Diagnosis present

## 2019-12-20 LAB — CBC WITH DIFFERENTIAL/PLATELET
Abs Immature Granulocytes: 0.04 10*3/uL (ref 0.00–0.07)
Basophils Absolute: 0 10*3/uL (ref 0.0–0.1)
Basophils Relative: 0 %
Eosinophils Absolute: 0 10*3/uL (ref 0.0–0.5)
Eosinophils Relative: 0 %
HCT: 35.7 % — ABNORMAL LOW (ref 39.0–52.0)
Hemoglobin: 12 g/dL — ABNORMAL LOW (ref 13.0–17.0)
Immature Granulocytes: 1 %
Lymphocytes Relative: 13 %
Lymphs Abs: 1 10*3/uL (ref 0.7–4.0)
MCH: 30.2 pg (ref 26.0–34.0)
MCHC: 33.6 g/dL (ref 30.0–36.0)
MCV: 89.7 fL (ref 80.0–100.0)
Monocytes Absolute: 1.2 10*3/uL — ABNORMAL HIGH (ref 0.1–1.0)
Monocytes Relative: 15 %
Neutro Abs: 5.6 10*3/uL (ref 1.7–7.7)
Neutrophils Relative %: 71 %
Platelets: 125 10*3/uL — ABNORMAL LOW (ref 150–400)
RBC: 3.98 MIL/uL — ABNORMAL LOW (ref 4.22–5.81)
RDW: 14.5 % (ref 11.5–15.5)
WBC: 7.8 10*3/uL (ref 4.0–10.5)
nRBC: 0 % (ref 0.0–0.2)

## 2019-12-20 LAB — URINALYSIS, COMPLETE (UACMP) WITH MICROSCOPIC
Bacteria, UA: NONE SEEN
Bilirubin Urine: NEGATIVE
Glucose, UA: NEGATIVE mg/dL
Ketones, ur: NEGATIVE mg/dL
Leukocytes,Ua: NEGATIVE
Nitrite: NEGATIVE
Protein, ur: 100 mg/dL — AB
Specific Gravity, Urine: 1.013 (ref 1.005–1.030)
pH: 5 (ref 5.0–8.0)

## 2019-12-20 LAB — COMPREHENSIVE METABOLIC PANEL
ALT: 24 U/L (ref 0–44)
AST: 36 U/L (ref 15–41)
Albumin: 3.7 g/dL (ref 3.5–5.0)
Alkaline Phosphatase: 67 U/L (ref 38–126)
Anion gap: 13 (ref 5–15)
BUN: 37 mg/dL — ABNORMAL HIGH (ref 8–23)
CO2: 19 mmol/L — ABNORMAL LOW (ref 22–32)
Calcium: 8.8 mg/dL — ABNORMAL LOW (ref 8.9–10.3)
Chloride: 107 mmol/L (ref 98–111)
Creatinine, Ser: 1.57 mg/dL — ABNORMAL HIGH (ref 0.61–1.24)
GFR calc Af Amer: 46 mL/min — ABNORMAL LOW (ref >60)
GFR calc non Af Amer: 39 mL/min — ABNORMAL LOW (ref >60)
Glucose, Bld: 123 mg/dL — ABNORMAL HIGH (ref 70–99)
Potassium: 4.1 mmol/L (ref 3.5–5.1)
Sodium: 139 mmol/L (ref 135–145)
Total Bilirubin: 1.7 mg/dL — ABNORMAL HIGH (ref 0.3–1.2)
Total Protein: 7.5 g/dL (ref 6.5–8.1)

## 2019-12-20 LAB — CREATININE, SERUM
Creatinine, Ser: 1.42 mg/dL — ABNORMAL HIGH (ref 0.61–1.24)
GFR calc Af Amer: 51 mL/min — ABNORMAL LOW (ref >60)
GFR calc non Af Amer: 44 mL/min — ABNORMAL LOW (ref >60)

## 2019-12-20 LAB — CBC
HCT: 32.9 % — ABNORMAL LOW (ref 39.0–52.0)
Hemoglobin: 10.8 g/dL — ABNORMAL LOW (ref 13.0–17.0)
MCH: 30.1 pg (ref 26.0–34.0)
MCHC: 32.8 g/dL (ref 30.0–36.0)
MCV: 91.6 fL (ref 80.0–100.0)
Platelets: 123 10*3/uL — ABNORMAL LOW (ref 150–400)
RBC: 3.59 MIL/uL — ABNORMAL LOW (ref 4.22–5.81)
RDW: 14.3 % (ref 11.5–15.5)
WBC: 6.9 10*3/uL (ref 4.0–10.5)
nRBC: 0 % (ref 0.0–0.2)

## 2019-12-20 LAB — PROCALCITONIN: Procalcitonin: <0.1 ng/mL

## 2019-12-20 LAB — GLUCOSE, CAPILLARY: Glucose-Capillary: 99 mg/dL (ref 70–99)

## 2019-12-20 LAB — SARS CORONAVIRUS 2 BY RT PCR (HOSPITAL ORDER, PERFORMED IN ~~LOC~~ HOSPITAL LAB): SARS Coronavirus 2: POSITIVE — AB

## 2019-12-20 LAB — LACTIC ACID, PLASMA: Lactic Acid, Venous: 1 mmol/L (ref 0.5–1.9)

## 2019-12-20 MED ORDER — INSULIN ASPART 100 UNIT/ML ~~LOC~~ SOLN
0.0000 [IU] | Freq: Three times a day (TID) | SUBCUTANEOUS | Status: DC
  Administered 2019-12-21: 13:00:00 3 [IU] via SUBCUTANEOUS
  Administered 2019-12-21: 16:00:00 2 [IU] via SUBCUTANEOUS
  Administered 2019-12-22: 14:00:00 3 [IU] via SUBCUTANEOUS
  Administered 2019-12-22 – 2019-12-23 (×2): 1 [IU] via SUBCUTANEOUS
  Administered 2019-12-24 (×2): 2 [IU] via SUBCUTANEOUS
  Filled 2019-12-20 (×7): qty 1

## 2019-12-20 MED ORDER — SODIUM CHLORIDE 0.9 % IV SOLN: 100.0000 mg | Freq: Every day | INTRAVENOUS | Status: DC

## 2019-12-20 MED ORDER — DEXAMETHASONE 4 MG PO TABS
6.0000 mg | ORAL_TABLET | ORAL | Status: DC
  Administered 2019-12-21: 6 mg via ORAL
  Filled 2019-12-20: qty 2

## 2019-12-20 MED ORDER — ENOXAPARIN SODIUM 40 MG/0.4ML ~~LOC~~ SOLN
40.0000 mg | SUBCUTANEOUS | Status: DC
  Administered 2019-12-21 – 2019-12-25 (×5): 40 mg via SUBCUTANEOUS
  Filled 2019-12-20 (×5): qty 0.4

## 2019-12-20 MED ORDER — DEXAMETHASONE SODIUM PHOSPHATE 10 MG/ML IJ SOLN
10.0000 mg | Freq: Once | INTRAMUSCULAR | Status: AC
  Administered 2019-12-20: 10 mg via INTRAVENOUS
  Filled 2019-12-20: qty 1

## 2019-12-20 MED ORDER — SODIUM CHLORIDE 0.9 % IV SOLN
200.0000 mg | Freq: Once | INTRAVENOUS | Status: DC
  Filled 2019-12-20: qty 40

## 2019-12-20 MED ORDER — HYDROCODONE-ACETAMINOPHEN 5-325 MG PO TABS: 1.0000 | ORAL_TABLET | ORAL | Status: DC | PRN

## 2019-12-20 MED ORDER — INSULIN ASPART 100 UNIT/ML ~~LOC~~ SOLN
3.0000 [IU] | Freq: Three times a day (TID) | SUBCUTANEOUS | Status: DC
  Administered 2019-12-21 – 2019-12-25 (×11): 3 [IU] via SUBCUTANEOUS
  Filled 2019-12-20 (×13): qty 1

## 2019-12-20 MED ORDER — ACETAMINOPHEN 325 MG PO TABS: 650.0000 mg | ORAL_TABLET | Freq: Four times a day (QID) | ORAL | Status: DC | PRN

## 2019-12-20 MED ORDER — INSULIN ASPART 100 UNIT/ML ~~LOC~~ SOLN
0.0000 [IU] | Freq: Every day | SUBCUTANEOUS | Status: DC
  Administered 2019-12-23 – 2019-12-24 (×2): 2 [IU] via SUBCUTANEOUS
  Filled 2019-12-20 (×2): qty 1

## 2019-12-20 MED ORDER — ONDANSETRON HCL 4 MG PO TABS: 4.0000 mg | ORAL_TABLET | Freq: Four times a day (QID) | ORAL | Status: DC | PRN

## 2019-12-20 MED ORDER — ZINC SULFATE 220 (50 ZN) MG PO CAPS
220.0000 mg | ORAL_CAPSULE | Freq: Every day | ORAL | Status: DC
  Administered 2019-12-20 – 2019-12-25 (×6): 220 mg via ORAL
  Filled 2019-12-20 (×6): qty 1

## 2019-12-20 MED ORDER — SODIUM CHLORIDE 0.9% FLUSH: 3.0000 mL | Freq: Once | INTRAVENOUS | Status: DC

## 2019-12-20 MED ORDER — INSULIN DETEMIR 100 UNIT/ML ~~LOC~~ SOLN
6.0000 [IU] | Freq: Two times a day (BID) | SUBCUTANEOUS | Status: DC
  Administered 2019-12-21 – 2019-12-25 (×8): 6 [IU] via SUBCUTANEOUS
  Filled 2019-12-20 (×11): qty 0.06

## 2019-12-20 MED ORDER — ALBUTEROL SULFATE HFA 108 (90 BASE) MCG/ACT IN AERS
2.0000 | INHALATION_SPRAY | Freq: Four times a day (QID) | RESPIRATORY_TRACT | Status: DC
  Administered 2019-12-21 – 2019-12-25 (×11): 2 via RESPIRATORY_TRACT
  Filled 2019-12-20 (×2): qty 6.7

## 2019-12-20 MED ORDER — HYDROCOD POLST-CPM POLST ER 10-8 MG/5ML PO SUER: 5.0000 mL | Freq: Two times a day (BID) | ORAL | Status: DC | PRN

## 2019-12-20 MED ORDER — ONDANSETRON HCL 4 MG/2ML IJ SOLN
4.0000 mg | Freq: Four times a day (QID) | INTRAMUSCULAR | Status: DC | PRN
  Administered 2019-12-21: 09:00:00 4 mg via INTRAVENOUS
  Filled 2019-12-20: qty 2

## 2019-12-20 MED ORDER — SODIUM CHLORIDE 0.9 % IV SOLN: 200.0000 mg | Freq: Once | INTRAVENOUS | Status: DC

## 2019-12-20 MED ORDER — SODIUM CHLORIDE 0.9 % IV SOLN
100.0000 mg | Freq: Every day | INTRAVENOUS | Status: DC
  Filled 2019-12-20: qty 20

## 2019-12-20 MED ORDER — GUAIFENESIN-DM 100-10 MG/5ML PO SYRP
10.0000 mL | ORAL_SOLUTION | ORAL | Status: DC | PRN
  Filled 2019-12-20: qty 10

## 2019-12-20 MED ORDER — ADULT MULTIVITAMIN W/MINERALS CH
1.0000 | ORAL_TABLET | Freq: Every day | ORAL | Status: DC
  Administered 2019-12-20 – 2019-12-25 (×6): 1 via ORAL
  Filled 2019-12-20 (×6): qty 1

## 2019-12-20 MED ORDER — ASCORBIC ACID 500 MG PO TABS
500.0000 mg | ORAL_TABLET | Freq: Every day | ORAL | Status: DC
  Administered 2019-12-20 – 2019-12-25 (×6): 500 mg via ORAL
  Filled 2019-12-20 (×7): qty 1

## 2019-12-20 MED ORDER — LINAGLIPTIN 5 MG PO TABS
5.0000 mg | ORAL_TABLET | Freq: Every day | ORAL | Status: DC
  Administered 2019-12-22 – 2019-12-25 (×4): 5 mg via ORAL
  Filled 2019-12-20 (×7): qty 1

## 2019-12-20 NOTE — Consult Note (Signed)
Remdesivir - Pharmacy Brief Note   O:  ALT: 24 CXR: "New bilateral heterogenous/ground-glass opacities concerning for multifocal infection" SpO2: Hypoxic requiring supplemental O2   A/P:  8/5 SARS-CoV-2 PCR positive  Remdesivir 200 mg IVPB once followed by 100 mg IVPB daily x 4 days.   Benita Gutter 12/20/2019 8:30 PM

## 2019-12-20 NOTE — H&P (Signed)
History and Physical    Jared Parker WCB:[id number] DOB: [birth year] DOA: 12/20/2019  PCP: System, Pcp Not In   Patient coming from: home  I have personally briefly reviewed patient's old medical records in [place]  Chief Complaint: Cough and congestion  HPI: Jared Parker is a 86 y.o. male with medical history significant for  DM, HTN, bladder cancer s/p TURBT followed by urology at [facility], COPD, who presents to the emergency room with a 1 week history of cough congestion weakness, shortness of breath with cough as well as decreased appetite and loss of taste for food.  Denies Covid contacts.  Vaccinated for Covid in March 2021.  Denies fever or chills, shortness of breath, nausea vomiting or diarrhea or abdominal pain ED Course: On arrival, afebrile, intermittently tachypneic to 22 with O2 sat 95% on room air but desaturating to 88% with minimal exertion.  BP 122/43.  Covid test positive.  Blood work with WBC 7.8, procalcitonin less than 0.1.  Creatinine 1.57, minimally increased from baseline of 1.22 on Care Everywhere.  Chest x-ray showed new bilateral heterogeneous groundglass opacities concerning for multifocal infection.  Patient started on remdesivir and Decadron.  Hospitalist consulted for admission.  Review of Systems: As per HPI otherwise all other systems on review of systems negative.    Past Medical History:  Diagnosis Date  . Actinic keratosis   . Bladder cancer ([place])    a. DX 1998 w/ recurrences in 2000, 2001, 2002, 2006, 12/2010, 06/2011, 12/2012, & 08/2013;  b. 06/2011 s/p TURBT;  c. Quarterly cystoscopies @ [facility], last 01/2015 ->nl.  . Cataracts, bilateral   . Colon polyps   . COPD (chronic obstructive pulmonary disease) ([place])   . Essential hypertension   . GERD (gastroesophageal reflux disease)   . Gout   . History of tobacco abuse   . Hyperlipidemia   . Insomnia   . Osteoarthritis   . Prostate hypertrophy   . Retinal detachment   . Seborrheic dermatitis     . Squamous cell carcinoma   . Thrombocytopenia ([place])   . Type II diabetes mellitus ([place])     Past Surgical History:  Procedure Laterality Date  . Bladder cancer     Per pt nine surgeries  . HERNIA REPAIR       reports that he quit smoking about 29 years ago. His smoking use included cigarettes. He has a 80.00 pack-year smoking history. He has never used smokeless tobacco. He reports that he does not drink alcohol and does not use drugs.  Allergies  Allergen Reactions  . Cephalosporins Hives  . Ciprofloxacin Hives, Shortness Of Breath and Rash  . Doxycycline Hives  . Streptomycin Hives  . Sulfa Antibiotics Hives  . Amoxicillin Hives  . Atenolol     Other reaction(s): Other (See Comments) Slowed his heart rate.  . Penicillins Hives  . Hydroxychloroquine Rash    Other reaction(s): Other (See Comments) unknown unknown Other reaction(s): Other (See Comments) unknown    Family History  Problem Relation Age of Onset  . Diabetes Mother        died @ 28  . Kidney failure Mother   . Other Father        died @ 59 of black lung  . Cancer Brother   . Diabetes Brother   . Other Other        two sisters - A & W.      Prior to Admission medications   Medication  Sig Start Date End Date Taking? Authorizing Provider  allopurinol (ZYLOPRIM) 100 MG tablet Take 2 tablets by mouth 2 (two) times daily.    [provider]  aspirin EC 81 MG tablet Take 81 mg by mouth daily.    [provider]  docusate sodium (COLACE) 100 MG capsule Take 100 mg by mouth as needed.     [provider]  dorzolamide (TRUSOPT) 2 % ophthalmic solution Place 1 drop into both eyes 3 (three) times daily.    [provider]  hydrochlorothiazide (HYDRODIURIL) 25 MG tablet Take 1 tablet by mouth daily.    [provider]  ketoconazole (NIZORAL) 2 % cream Apply 1 application topically as needed for irritation.    [provider]  ketoconazole (NIZORAL) 2 %  shampoo Apply 1 application topically as needed for irritation.    [provider]  lisinopril (PRINIVIL,ZESTRIL) 10 MG tablet Take 1 tablet by mouth daily.    [provider]  metroNIDAZOLE (METROCREAM) 0.75 % cream Apply topically as needed.    [provider]  Omega-3 Fatty Acids (FISH OIL) 1000 MG CAPS Take 1 capsule by mouth daily.    [provider]  omeprazole (PRILOSEC) 20 MG capsule Take 1 capsule by mouth daily.    [provider]  oxyCODONE (OXY IR/ROXICODONE) 5 MG immediate release tablet Take 5-10 mg by mouth 4 (four) times daily as needed.    [provider]  simvastatin (ZOCOR) 10 MG tablet Take 10 mg by mouth daily.    [provider]  SPIRIVA HANDIHALER 18 MCG inhalation capsule Place 1 capsule into inhaler and inhale daily.    [provider]  tamsulosin (FLOMAX) 0.4 MG CAPS capsule Take 0.4 mg by mouth 2 (two) times daily.    [provider]  traMADol (ULTRAM) 50 MG tablet Take 1 tablet by mouth as needed.     [provider]    Physical Exam: Vitals:   12/20/19 1154 12/20/19 1156 12/20/19 2009  BP:  (!) 122/43 (!) 150/68  Pulse:  71 68  Resp:  16 (!) 22  Temp:  97.7 F (36.5 C)   TempSrc:  Oral   SpO2:  95% 93%  Weight: 78 kg    Height: 5\' 11"  (1.803 m)       Vitals:   12/20/19 1154 12/20/19 1156 12/20/19 2009  BP:  (!) 122/43 (!) 150/68  Pulse:  71 68  Resp:  16 (!) 22  Temp:  97.7 F (36.5 C)   TempSrc:  Oral   SpO2:  95% 93%  Weight: 78 kg    Height: 5\' 11"  (1.803 m)        Constitutional:  Ill-looking with mild conversational dyspnea HEENT:      Head: Normocephalic and atraumatic.         Eyes: PERLA, EOMI, Conjunctivae are normal. Sclera is non-icteric.       Mouth/Throat: Mucous membranes are moist.       Neck: Supple with no signs of meningismus. Cardiovascular: Regular rate and rhythm. No murmurs, gallops, or rubs. 2+ symmetrical distal pulses are  present . No JVD. No LE edema Respiratory: Respiratory effort slightly increased.Lungs sounds diminished bilaterally.  Scattered wheezes Gastrointestinal: Soft, non tender, and non distended with positive bowel sounds. No rebound or guarding. Genitourinary: No CVA tenderness. Musculoskeletal: Nontender with normal range of motion in all extremities. No cyanosis, or erythema of extremities. Neurologic: Normal speech and language. Face is symmetric. Moving all extremities.  No gross focal neurologic deficits . Skin: Skin is warm, dry.  No rash or ulcers Psychiatric: Mood and affect are normal Speech and behavior are normal   Labs on Admission: I have personally reviewed following labs and imaging studies  CBC: Recent Labs  Lab 12/20/19 1201  WBC 7.8  NEUTROABS 5.6  HGB 12.0*  HCT 35.7*  MCV 89.7  PLT 389*   Basic Metabolic Panel: Recent Labs  Lab 12/20/19 1201  NA 139  K 4.1  CL 107  CO2 19*  GLUCOSE 123*  BUN 37*  CREATININE 1.57*  CALCIUM 8.8*   GFR: Estimated Creatinine Clearance: 36 mL/min (A) (by C-G formula based on SCr of 1.57 mg/dL (H)). Liver Function Tests: Recent Labs  Lab 12/20/19 1201  AST 36  ALT 24  ALKPHOS 67  BILITOT 1.7*  PROT 7.5  ALBUMIN 3.7   No results for input(s): LIPASE, AMYLASE in the last 168 hours. No results for input(s): AMMONIA in the last 168 hours. Coagulation Profile: No results for input(s): INR, PROTIME in the last 168 hours. Cardiac Enzymes: No results for input(s): CKTOTAL, CKMB, CKMBINDEX, TROPONINI in the last 168 hours. BNP (last 3 results) No results for input(s): PROBNP in the last 8760 hours. HbA1C: No results for input(s): HGBA1C in the last 72 hours. CBG: No results for input(s): GLUCAP in the last 168 hours. Lipid Profile: No results for input(s): CHOL, HDL, LDLCALC, TRIG, CHOLHDL, LDLDIRECT in the last 72 hours. Thyroid Function Tests: No results for input(s): TSH, T4TOTAL, FREET4, T3FREE, THYROIDAB in the  last 72 hours. Anemia Panel: No results for input(s): VITAMINB12, FOLATE, FERRITIN, TIBC, IRON, RETICCTPCT in the last 72 hours. Urine analysis:    Component Value Date/Time   COLORURINE YELLOW (A) 12/20/2019 1156   APPEARANCEUR HAZY (A) 12/20/2019 1156   LABSPEC 1.013 12/20/2019 1156   PHURINE 5.0 12/20/2019 1156   GLUCOSEU NEGATIVE 12/20/2019 1156   HGBUR SMALL (A) 12/20/2019 1156   BILIRUBINUR NEGATIVE 12/20/2019 1156   KETONESUR NEGATIVE 12/20/2019 1156   PROTEINUR 100 (A) 12/20/2019 1156   NITRITE NEGATIVE 12/20/2019 1156   LEUKOCYTESUR NEGATIVE 12/20/2019 1156    Radiological Exams on Admission: DG Chest 2 View  Result Date: 12/20/2019 CLINICAL DATA:  Cough EXAM: CHEST - 2 VIEW COMPARISON:  03/25/2015 chest radiograph and prior. FINDINGS: There are new heterogenous/ground-glass opacities involving the right greater than left bilateral mid lung and bases. No pneumothorax or pleural effusion. Cardiomediastinal silhouette within normal limits. Aortic atherosclerotic calcifications. No acute osseous abnormality. IMPRESSION: New bilateral heterogenous/ground-glass opacities concerning for multifocal infection. Electronically Signed   By: Primitivo Gauze M.D.   On: 12/20/2019 12:32    EKG: Not done in ER  Assessment/Plan 86 year old male with history of DM, HTN, bladder cancer s/p TURBT followed by [facility]rology at [facility] COPD, who presents to the emergency room with a 1 week history of cough congestion weakness as well as decreased appetite and loss of taste for food.  O2 sat 88 on room air, Covid positive, CXR typical for Covid.  Vaccinated March 2021.    Acute respiratory failure due to COVID-19 [facility])   Pneumonia due to COVID-19 virus -Patient presents with cough, congestion, poor appetite, generalized weakness for the past week -O2 sat 88 on room air, Covid positive, CXR typical for Covid.  Vaccinated March 2021 -Remdesivir, Decadron, albuterol, antitussives, vitamins, -Supplemental  oxygen -Follow inflammatory biomarkers -   Type II diabetes mellitus (HCC) -Supplemental insulin coverage    Essential hypertension -Blood pressure soft so  we will hold antihypertensives    COPD with chronic bronchitis ([place]) -Continue home inhalers    Malignant neoplasm of bladder (HCC) -No acute concerns at this time    DVT prophylaxis: Lovenox  Code Status: full code  Family Communication:  none  Disposition Plan: Back to previous home environment Consults called: none  Status:At the time of admission, it appears that the appropriate admission status for this patient is INPATIENT. This is judged to be reasonable and necessary in order to provide the required intensity of service to ensure the patient's safety given the presenting symptoms, physical exam findings, and initial radiographic and laboratory data in the context of their  Comorbid conditions.   Patient requires inpatient status due to high intensity of service, high risk for further deterioration and high frequency of surveillance required.   I certify that at the point of admission it is my clinical judgment that the patient will require inpatient hospital care spanning beyond [place] MD [facility]     12/20/2019, 9:05 PM

## 2019-12-20 NOTE — ED Provider Notes (Signed)
[facility] Emergency Department Provider Note   ____________________________________________   I have reviewed the triage vital signs and the nursing notes.   HISTORY  Chief Complaint Congestion  History limited by: Not Limited   HPI Jared Parker is a 86 y.o. male who presents to the emergency department today with concerns for congestion, cough, weakness. Symptoms have been present for the past 4-5 days. The patient finished his COVID vaccine series in March. Denies any known sick contacts. Says that his cough has been productive of phlegm. He has had change to his taste. The patient has had associated nausea.    Records reviewed. Per medical record review patient has a history of COPD.  Past Medical History:  Diagnosis Date  . Actinic keratosis   . Bladder cancer ([place])    a. DX 1998 w/ recurrences in 2000, 2001, 2002, 2006, 12/2010, 06/2011, 12/2012, & 08/2013;  b. 06/2011 s/p TURBT;  c. Quarterly cystoscopies @ [facility], last 01/2015 ->nl.  . Cataracts, bilateral   . Colon polyps   . COPD (chronic obstructive pulmonary disease) ([place])   . Essential hypertension   . GERD (gastroesophageal reflux disease)   . Gout   . History of tobacco abuse   . Hyperlipidemia   . Insomnia   . Osteoarthritis   . Prostate hypertrophy   . Retinal detachment   . Seborrheic dermatitis   . Squamous cell carcinoma   . Thrombocytopenia ([place])   . Type II diabetes mellitus [facility])     Patient Active Problem List   Diagnosis Date Noted  . Type II diabetes mellitus ([place])   . Essential hypertension   . Hyperlipidemia     Past Surgical History:  Procedure Laterality Date  . Bladder cancer     Per pt nine surgeries  . HERNIA REPAIR      Prior to Admission medications   Medication Sig Start Date End Date Taking? Authorizing Provider  allopurinol (ZYLOPRIM) 100 MG tablet Take 2 tablets by mouth 2 (two) times daily.    [provider]  aspirin EC 81 MG tablet Take  81 mg by mouth daily.    [provider]  docusate sodium (COLACE) 100 MG capsule Take 100 mg by mouth as needed.     [provider]  dorzolamide (TRUSOPT) 2 % ophthalmic solution Place 1 drop into both eyes 3 (three) times daily.    [provider]  hydrochlorothiazide (HYDRODIURIL) 25 MG tablet Take 1 tablet by mouth daily.    [provider]  ketoconazole (NIZORAL) 2 % cream Apply 1 application topically as needed for irritation.    [provider]  ketoconazole (NIZORAL) 2 % shampoo Apply 1 application topically as needed for irritation.    [provider]  lisinopril (PRINIVIL,ZESTRIL) 10 MG tablet Take 1 tablet by mouth daily.    [provider]  metroNIDAZOLE (METROCREAM) 0.75 % cream Apply topically as needed.    [provider]  Omega-3 Fatty Acids (FISH OIL) 1000 MG CAPS Take 1 capsule by mouth daily.    [provider]  omeprazole (PRILOSEC) 20 MG capsule Take 1 capsule by mouth daily.    [provider]  oxyCODONE (OXY IR/ROXICODONE) 5 MG immediate release tablet Take 5-10 mg by mouth 4 (four) times daily as needed.    [provider]  simvastatin (ZOCOR) 10 MG tablet Take 10 mg by mouth daily.    [provider]  SPIRIVA HANDIHALER 18 MCG inhalation capsule Place  1 capsule into inhaler and inhale daily.    [provider]  tamsulosin (FLOMAX) 0.4 MG CAPS capsule Take 0.4 mg by mouth 2 (two) times daily.    [provider]  traMADol (ULTRAM) 50 MG tablet Take 1 tablet by mouth as needed.     [provider]    Allergies Cephalosporins, Ciprofloxacin, Doxycycline, Streptomycin, Sulfa antibiotics, Amoxicillin, Atenolol, Penicillins, and Hydroxychloroquine  Family History  Problem Relation Age of Onset  . Diabetes Mother        died @ 81  . Kidney failure Mother   . Other Father        died @ 33 of black lung  . Cancer Brother   . Diabetes  Brother   . Other Other        two sisters - A & W.    Social History Social History   Tobacco Use  . Smoking status: Former Smoker    Packs/day: 2.00    Years: 40.00    Pack years: 80.00    Types: Cigarettes    Quit date: 03/24/1990    Years since quitting: 29.7  . Smokeless tobacco: Never Used  Substance Use Topics  . Alcohol use: No    Alcohol/week: 0.0 standard drinks  . Drug use: No    Review of Systems Constitutional: No fever/chills Eyes: No visual changes. ENT: Positive for congestion. Cardiovascular: Denies chest pain. Respiratory: Positive for cough. Gastrointestinal: Positive for nausea, decreased appetite.  Genitourinary: Negative for dysuria. Musculoskeletal: Negative for back pain. Skin: Negative for rash. Neurological: Positive for change in taste ____________________________________________   PHYSICAL EXAM:  VITAL SIGNS: ED Triage Vitals  Enc Vitals Group     BP 12/20/19 1156 (!) 122/43     Pulse Rate 12/20/19 1156 71     Resp 12/20/19 1156 16     Temp 12/20/19 1156 97.7 F (36.5 C)     Temp Source 12/20/19 1156 Oral     SpO2 12/20/19 1156 95 %     Weight 12/20/19 1154 171 lb 15.3 oz (78 kg)     Height 12/20/19 1154 5\' 11"  (1.803 m)     Head Circumference --      Peak Flow --      Pain Score 12/20/19 1154 0   Constitutional: Alert and oriented.  Eyes: Conjunctivae are normal.  ENT      Head: Normocephalic and atraumatic.      Nose: No congestion/rhinnorhea.      Mouth/Throat: Mucous membranes are moist.      Neck: No stridor. Hematological/Lymphatic/Immunilogical: No cervical lymphadenopathy. Cardiovascular: Normal rate, regular rhythm.  No murmurs, rubs, or gallops.  Respiratory: Normal respiratory effort without tachypnea nor retractions. Diffuse wheezing. Poor air movement. Gastrointestinal: Soft and non tender. No rebound. No guarding.  Genitourinary: Deferred Musculoskeletal: Normal range of motion in all extremities. No lower  extremity edema. Neurologic:  Normal speech and language. No gross focal neurologic deficits are appreciated.  Skin:  Skin is warm, dry and intact. No rash noted. Psychiatric: Mood and affect are normal. Speech and behavior are normal. Patient exhibits appropriate insight and judgment.  ____________________________________________    LABS (pertinent positives/negatives)  Lactic acid 1.0 CMP na 139, k 4.1, glu 123, cr 1.57 CBC wbc 7.8, hgb 12.0, plt 125 COVID positive Procalcitonin <.10 ____________________________________________   EKG  None  ____________________________________________    RADIOLOGY  CXR New bilateral ground glass opacities.  ____________________________________________   PROCEDURES  Procedures  ____________________________________________   INITIAL IMPRESSION /  ASSESSMENT AND PLAN / ED COURSE  Pertinent labs & imaging results that were available during my care of the patient were reviewed by me and considered in my medical decision making (see chart for details).   Patient presented to the emergency department today because of concerns for signs and symptoms consistent with Covid.  Patient did test positive here.  Procalcitonin was less than 0.1.  Given the patient was hypoxic on room air will plan on admission.  Did write for remdesivir and steroids.  ____________________________________________   FINAL CLINICAL IMPRESSION(S) / ED DIAGNOSES  Final diagnoses:  COVID-19  Hypoxia     Note: This dictation was prepared with Dragon dictation. Any transcriptional errors that result from this process are unintentional     Nance Pear, MD 12/20/19 2019

## 2019-12-20 NOTE — ED Triage Notes (Signed)
Arrives with one week history of productive cough and c/o general weakness'  Lives home alone.  Per EMS report o2 sat 92% on 2L  AAOx3.  Skin warm and dry. NAD

## 2019-12-21 ENCOUNTER — Other Ambulatory Visit: Payer: Self-pay

## 2019-12-21 DIAGNOSIS — I10 Essential (primary) hypertension: Secondary | ICD-10-CM

## 2019-12-21 DIAGNOSIS — Z515 Encounter for palliative care: Secondary | ICD-10-CM

## 2019-12-21 DIAGNOSIS — J1282 Pneumonia due to coronavirus disease 2019: Secondary | ICD-10-CM

## 2019-12-21 DIAGNOSIS — J449 Chronic obstructive pulmonary disease, unspecified: Secondary | ICD-10-CM

## 2019-12-21 DIAGNOSIS — Z7189 Other specified counseling: Secondary | ICD-10-CM

## 2019-12-21 LAB — COMPREHENSIVE METABOLIC PANEL
ALT: 30 U/L (ref 0–44)
AST: 37 U/L (ref 15–41)
Albumin: 3.7 g/dL (ref 3.5–5.0)
Alkaline Phosphatase: 73 U/L (ref 38–126)
Anion gap: 11 (ref 5–15)
BUN: 42 mg/dL — ABNORMAL HIGH (ref 8–23)
CO2: 20 mmol/L — ABNORMAL LOW (ref 22–32)
Calcium: 8.7 mg/dL — ABNORMAL LOW (ref 8.9–10.3)
Chloride: 108 mmol/L (ref 98–111)
Creatinine, Ser: 1.53 mg/dL — ABNORMAL HIGH (ref 0.61–1.24)
GFR calc Af Amer: 47 mL/min — ABNORMAL LOW (ref >60)
GFR calc non Af Amer: 41 mL/min — ABNORMAL LOW (ref >60)
Glucose, Bld: 168 mg/dL — ABNORMAL HIGH (ref 70–99)
Potassium: 5 mmol/L (ref 3.5–5.1)
Sodium: 139 mmol/L (ref 135–145)
Total Bilirubin: 1.5 mg/dL — ABNORMAL HIGH (ref 0.3–1.2)
Total Protein: 7.4 g/dL (ref 6.5–8.1)

## 2019-12-21 LAB — CBC WITH DIFFERENTIAL/PLATELET
Abs Immature Granulocytes: 0.08 10*3/uL — ABNORMAL HIGH (ref 0.00–0.07)
Basophils Absolute: 0 10*3/uL (ref 0.0–0.1)
Basophils Relative: 0 %
Eosinophils Absolute: 0 10*3/uL (ref 0.0–0.5)
Eosinophils Relative: 0 %
HCT: 34.2 % — ABNORMAL LOW (ref 39.0–52.0)
Hemoglobin: 11.4 g/dL — ABNORMAL LOW (ref 13.0–17.0)
Immature Granulocytes: 1 %
Lymphocytes Relative: 9 %
Lymphs Abs: 0.7 10*3/uL (ref 0.7–4.0)
MCH: 29.9 pg (ref 26.0–34.0)
MCHC: 33.3 g/dL (ref 30.0–36.0)
MCV: 89.8 fL (ref 80.0–100.0)
Monocytes Absolute: 0.2 10*3/uL (ref 0.1–1.0)
Monocytes Relative: 3 %
Neutro Abs: 6.5 10*3/uL (ref 1.7–7.7)
Neutrophils Relative %: 87 %
Platelets: 123 10*3/uL — ABNORMAL LOW (ref 150–400)
RBC: 3.81 MIL/uL — ABNORMAL LOW (ref 4.22–5.81)
RDW: 14.3 % (ref 11.5–15.5)
Smear Review: NORMAL
WBC: 7.8 10*3/uL (ref 4.0–10.5)
nRBC: 0 % (ref 0.0–0.2)

## 2019-12-21 LAB — FERRITIN: Ferritin: 723 ng/mL — ABNORMAL HIGH (ref 24–336)

## 2019-12-21 LAB — GLUCOSE, CAPILLARY
Glucose-Capillary: 197 mg/dL — ABNORMAL HIGH (ref 70–99)
Glucose-Capillary: 220 mg/dL — ABNORMAL HIGH (ref 70–99)
Glucose-Capillary: 156 mg/dL — ABNORMAL HIGH (ref 70–99)
Glucose-Capillary: 149 mg/dL — ABNORMAL HIGH (ref 70–99)

## 2019-12-21 LAB — PHOSPHORUS: Phosphorus: 4.2 mg/dL (ref 2.5–4.6)

## 2019-12-21 LAB — MAGNESIUM: Magnesium: 2.6 mg/dL — ABNORMAL HIGH (ref 1.7–2.4)

## 2019-12-21 LAB — FIBRIN DERIVATIVES D-DIMER (ARMC ONLY): Fibrin derivatives D-dimer (ARMC): 2512.72 ng/mL (FEU) — ABNORMAL HIGH (ref 0.00–499.00)

## 2019-12-21 LAB — C-REACTIVE PROTEIN: CRP: 17.3 mg/dL — ABNORMAL HIGH (ref <1.0)

## 2019-12-21 MED ORDER — ASPIRIN EC 81 MG PO TBEC
81.0000 mg | DELAYED_RELEASE_TABLET | Freq: Every day | ORAL | Status: DC
  Administered 2019-12-22 – 2019-12-25 (×4): 81 mg via ORAL
  Filled 2019-12-21 (×4): qty 1

## 2019-12-21 MED ORDER — SODIUM CHLORIDE 0.9 % IV SOLN
100.0000 mg | Freq: Every day | INTRAVENOUS | Status: AC
  Administered 2019-12-22 – 2019-12-25 (×4): 100 mg via INTRAVENOUS
  Filled 2019-12-21: qty 100
  Filled 2019-12-21 (×2): qty 20
  Filled 2019-12-21: qty 100

## 2019-12-21 MED ORDER — SODIUM CHLORIDE 0.9 % IV SOLN
200.0000 mg | Freq: Once | INTRAVENOUS | Status: AC
  Administered 2019-12-21: 12:00:00 200 mg via INTRAVENOUS
  Filled 2019-12-21: qty 200
  Filled 2019-12-21: qty 40

## 2019-12-21 MED ORDER — DORZOLAMIDE HCL 2 % OP SOLN
1.0000 [drp] | Freq: Three times a day (TID) | OPHTHALMIC | Status: DC
  Administered 2019-12-21 – 2019-12-25 (×9): 1 [drp] via OPHTHALMIC
  Filled 2019-12-21 (×2): qty 10

## 2019-12-21 MED ORDER — ALLOPURINOL 100 MG PO TABS
100.0000 mg | ORAL_TABLET | Freq: Every day | ORAL | Status: DC
  Administered 2019-12-22 – 2019-12-25 (×4): 100 mg via ORAL
  Filled 2019-12-21 (×4): qty 1

## 2019-12-21 MED ORDER — SIMVASTATIN 20 MG PO TABS
10.0000 mg | ORAL_TABLET | Freq: Every day | ORAL | Status: DC
  Administered 2019-12-21 – 2019-12-24 (×4): 10 mg via ORAL
  Filled 2019-12-21 (×4): qty 1

## 2019-12-21 MED ORDER — MIRTAZAPINE 15 MG PO TABS
30.0000 mg | ORAL_TABLET | Freq: Every day | ORAL | Status: DC
  Administered 2019-12-21 – 2019-12-24 (×4): 30 mg via ORAL
  Filled 2019-12-21 (×4): qty 2

## 2019-12-21 MED ORDER — DULOXETINE HCL 20 MG PO CPEP
20.0000 mg | ORAL_CAPSULE | Freq: Every day | ORAL | Status: DC
  Administered 2019-12-21 – 2019-12-25 (×5): 20 mg via ORAL
  Filled 2019-12-21 (×6): qty 1

## 2019-12-21 MED ORDER — LOPERAMIDE HCL 2 MG PO CAPS
2.0000 mg | ORAL_CAPSULE | ORAL | Status: DC | PRN
  Administered 2019-12-21: 16:00:00 2 mg via ORAL
  Filled 2019-12-21: qty 1

## 2019-12-21 NOTE — Consult Note (Addendum)
Consultation Note Date: 12/21/2019   Patient Name: Jared Parker  DOB: [birth year]  MRN: [id number]  Age / Sex: 86 y.o., male  PCP: System, Pcp Not In Referring Physician: Max Sane, MD  Reason for Consultation: Establishing goals of care  HPI/Patient Profile: Jared Parker is a 86 y.o. male with medical history significant for  DM, HTN, bladder cancer s/p TURBT followed by urology at [facility], COPD, who presents to the emergency room with a 1 week history of cough congestion weakness, shortness of breath with cough as well as decreased appetite and loss of taste for food.   Clinical Assessment and Goals of Care: Patient is on covid isolation. He states he lives alone. He has 2/3 children living. He states he has worked in [employer] all his life. He states prior to covid he loved to play BINGO and go out to eat with friends/family.   Functionally, he is independent and uses no assistive devices.   We discussed his diagnoses, prognosis, GOC, EOL wishes disposition and options.  A detailed discussion was had today regarding advanced directives.  Concepts specific to code status, artifical feeding and hydration, IV antibiotics and rehospitalization were discussed.  The difference between an aggressive medical intervention path and a comfort care path was discussed.  Values and goals of care important to patient and family were attempted to be elicited.  Discussed limitations of medical interventions to prolong quality of life in some situations and discussed the concept of human mortality.  He discusses his cancer recurrences. He states he will be glad to leave this earth as he feels he has been here long enough, and cannot go on forever. He states he would like to see how he does over the weekend. He states he wants to treat the treatable over the weekend, but would not want to be placed on a ventilator or have  CPR. He would never want a feeding tube. Will speak again on Monday.    I reviewed and completed a MOST form today with patient. Assistance from [employer] was provided, a plastic sleeve was used, and the signed original was placed in the chart. A photocopy was placed in the chart to be scanned into EMR. The patient outlined their wishes for the following treatment decisions:  Cardiopulmonary Resuscitation: Do Not Attempt Resuscitation (DNR/No CPR)  Medical Interventions: Limited Additional Interventions: Use medical treatment, IV fluids and cardiac monitoring as indicated, DO NOT USE intubation or mechanical ventilation. May consider use of less invasive airway support such as BiPAP or CPAP. Also provide comfort measures. Transfer to the hospital if indicated. Avoid intensive care.   Antibiotics: Antibiotics if indicated  IV Fluids: IV fluids if indicated  Feeding Tube: No feeding tube    SUMMARY OF RECOMMENDATIONS   DNR/DNI. Treat the treatable. Will re- visit Monday.   Prognosis:   Unable to determine      Primary Diagnoses: Present on Admission: . Pneumonia due to COVID-19 virus . Essential hypertension . Malignant neoplasm of bladder ([place])  I have reviewed the medical record, interviewed the patient and family, and examined the patient. The following aspects are pertinent.  Past Medical History:  Diagnosis Date  . Actinic keratosis   . Bladder cancer ([place])    a. DX 1998 w/ recurrences in 2000, 2001, 2002, 2006, 12/2010, 06/2011, 12/2012, & 08/2013;  b. 06/2011 s/p TURBT;  c. Quarterly cystoscopies @ [facility], last 01/2015 ->nl.  . Cataracts, bilateral   . Colon polyps   . COPD (chronic obstructive pulmonary disease) ([place])   . Essential hypertension   . GERD (gastroesophageal reflux disease)   . Gout   . History of tobacco abuse   . Hyperlipidemia   . Insomnia   . Osteoarthritis   . Prostate hypertrophy   . Retinal detachment   . Seborrheic dermatitis   . Squamous cell carcinoma    . Thrombocytopenia ([place])   . Type II diabetes mellitus ([place])    Social History   Socioeconomic History  . Marital status: Married    Spouse name: Not on file  . Number of children: Not on file  . Years of education: Not on file  . Highest education level: Not on file  Occupational History  . Not on file  Tobacco Use  . Smoking status: Former Smoker    Packs/day: 2.00    Years: 40.00    Pack years: 80.00    Types: Cigarettes    Quit date: 03/24/1990    Years since quitting: 29.7  . Smokeless tobacco: Never Used  Substance and Sexual Activity  . Alcohol use: No    Alcohol/week: 0.0 standard drinks  . Drug use: No  . Sexual activity: Never  Other Topics Concern  . Not on file  Social History Narrative   Lives in [place] by himself.  Retired heavy Company secretary.  Very active.  Still push mows his own yard.   Social Determinants of Health   Financial Resource Strain:   . Difficulty of Paying Living Expenses:   Food Insecurity:   . Worried About Charity fundraiser in the Last Year:   . Arboriculturist in the Last Year:   Transportation Needs:   . Film/video editor (Medical):   Marland Kitchen Lack of Transportation (Non-Medical):   Physical Activity:   . Days of Exercise per Week:   . Minutes of Exercise per Session:   Stress:   . Feeling of Stress :   Social Connections:   . Frequency of Communication with Friends and Family:   . Frequency of Social Gatherings with Friends and Family:   . Attends Religious Services:   . Active Member of Clubs or Organizations:   . Attends Archivist Meetings:   Marland Kitchen Marital Status:    Family History  Problem Relation Age of Onset  . Diabetes Mother        died @ 7  . Kidney failure Mother   . Other Father        died @ 59 of black lung  . Cancer Brother   . Diabetes Brother   . Other Other        two sisters - A & W.   Scheduled Meds: . albuterol  2 puff Inhalation Q6H  . [START ON 12/22/2019] allopurinol  100  mg Oral Daily  . vitamin C  500 mg Oral Daily  . [START ON 12/22/2019] aspirin EC  81 mg Oral Daily  . dexamethasone  6 mg Oral Q24H  . dorzolamide  1 drop Both Eyes TID  . DULoxetine  20 mg Oral Daily  . enoxaparin (LOVENOX) injection  40 mg Subcutaneous Q24H  . insulin aspart  0-5 Units Subcutaneous QHS  . insulin aspart  0-9 Units Subcutaneous TID WC  . insulin aspart  3 Units Subcutaneous TID WC  . insulin detemir  6 Units Subcutaneous BID  . linagliptin  5 mg Oral Daily  . mirtazapine  30 mg Oral QHS  . multivitamin with minerals  1 tablet Oral Daily  . simvastatin  10 mg Oral QHS  . sodium chloride flush  3 mL Intravenous Once  . zinc sulfate  220 mg Oral Daily   Continuous Infusions: . [START ON 12/22/2019] remdesivir 100 mg in NS 100 mL     PRN Meds:.acetaminophen, chlorpheniramine-HYDROcodone, guaiFENesin-dextromethorphan, HYDROcodone-acetaminophen, loperamide, ondansetron **OR** ondansetron (ZOFRAN) IV Medications Prior to Admission:  Prior to Admission medications   Medication Sig Start Date End Date Taking? Authorizing Provider  allopurinol (ZYLOPRIM) 100 MG tablet Take 200 mg by mouth daily.    Yes [provider]  aspirin EC 81 MG tablet Take 81 mg by mouth daily.   Yes [provider]  docusate sodium (COLACE) 100 MG capsule Take 100 mg by mouth daily as needed for mild constipation.    Yes [provider]  dorzolamide (TRUSOPT) 2 % ophthalmic solution Place 1 drop into both eyes 3 (three) times daily.   Yes [provider]  Doxepin HCl 3 MG TABS Take 3 mg by mouth at bedtime.   Yes [provider]  DULoxetine (CYMBALTA) 20 MG capsule Take 20 mg by mouth daily.   Yes [provider]  fluticasone (FLOVENT HFA) 110 MCG/ACT inhaler Inhale 2 puffs into the lungs 2 (two) times daily.   Yes [provider]  furosemide (LASIX) 20 MG tablet Take 20 mg by mouth daily.   Yes [provider]  losartan (COZAAR) 50  MG tablet Take 50 mg by mouth daily.   Yes [provider]  mirtazapine (REMERON) 30 MG tablet Take 30 mg by mouth at bedtime.   Yes [provider]  NIFEdipine (PROCARDIA-XL/NIFEDICAL-XL) 30 MG 24 hr tablet Take 30 mg by mouth daily.   Yes [provider]  Omega-3 Fatty Acids (FISH OIL) 1000 MG CAPS Take 1,000 mg by mouth daily.    Yes [provider]  omeprazole (PRILOSEC) 20 MG capsule Take 20 mg by mouth daily.    Yes [provider]  oxyCODONE (OXY IR/ROXICODONE) 5 MG immediate release tablet Take 5-10 mg by mouth every 6 (six) hours as needed for moderate pain or severe pain.    Yes [provider]  simvastatin (ZOCOR) 10 MG tablet Take 10 mg by mouth daily.   Yes [provider]  terazosin (HYTRIN) 5 MG capsule Take 5 mg by mouth at bedtime.   Yes [provider]   Allergies  Allergen Reactions  . Cephalosporins Hives  . Ciprofloxacin Hives, Shortness Of Breath and Rash  . Doxycycline Hives  . Streptomycin Hives  . Sulfa Antibiotics Hives  . Amoxicillin Hives  . Atenolol     Other reaction(s): Other (See Comments) Slowed his heart rate.  . Penicillins Hives  . Hydroxychloroquine Rash    Other reaction(s): Other (See Comments) unknown unknown Other reaction(s): Other (See Comments) unknown   Review of Systems  All other systems reviewed and are negative.    Vital Signs: BP 116/68 (BP Location: Left Arm)   Pulse 70  Temp 97.6 F (36.4 C)   Resp 20   Ht 5\' 11"  (1.803 m)   Wt 74.8 kg   SpO2 92%   BMI 23.01 kg/m  Pain Scale: 0-10   Pain Score: 0-No pain   SpO2: SpO2: 92 % O2 Device:SpO2: 92 % O2 Flow Rate: .O2 Flow Rate (L/min): 2 L/min  IO: Intake/output summary: No intake or output data in the 24 hours ending 12/21/19 1623  LBM: Last BM Date: 12/20/19 Baseline Weight: Weight: 78 kg Most recent weight: Weight: 74.8 kg     Palliative Assessment/Data:    COVID-19 DISASTER DECLARATION:     FULL CONTACT PHYSICAL EXAMINATION WAS NOT POSSIBLE DUE TO TREATMENT OF COVID-19  AND CONSERVATION OF PERSONAL PROTECTIVE EQUIPMENT.   Patient assessed or the symptoms described in the history of present illness.  In the context of the Global COVID-19 pandemic, which necessitated consideration that the patient might be at risk for infection with the SARS-CoV-2 virus that causes COVID-19, Institutional protocols and algorithms that pertain to the evaluation of patients at risk for COVID-19 are in a state of rapid change based on information released by regulatory bodies including the CDC and federal and state organizations. These policies and algorithms were followed during the patient's care while in hospital.   Time In: 3:50 Time Out: 4:30 Time Total: 40 min Greater than 50%  of this time was spent counseling and coordinating care related to the above assessment and plan.  Signed by: Asencion Gowda, NP   Please contact Palliative Medicine Team phone at [phone] for questions and concerns.  For individual provider: See Shea Evans

## 2019-12-21 NOTE — Progress Notes (Signed)
[place] at [place] NAME: Jared Parker    MR#:  [id number]  DATE OF BIRTH:  [birth year]  SUBJECTIVE:  CHIEF COMPLAINT:  No chief complaint on file.  Cough and congestion present.  Overall feeling better REVIEW OF SYSTEMS:  Review of Systems  Constitutional: Negative for diaphoresis, fever, malaise/fatigue and weight loss.  HENT: Negative for ear discharge, ear pain, hearing loss, nosebleeds, sore throat and tinnitus.   Eyes: Negative for blurred vision and pain.  Respiratory: Positive for cough and shortness of breath. Negative for hemoptysis and wheezing.   Cardiovascular: Negative for chest pain, palpitations, orthopnea and leg swelling.  Gastrointestinal: Negative for abdominal pain, blood in stool, constipation, diarrhea, heartburn, nausea and vomiting.  Genitourinary: Negative for dysuria, frequency and urgency.  Musculoskeletal: Negative for back pain and myalgias.  Skin: Negative for itching and rash.  Neurological: Negative for dizziness, tingling, tremors, focal weakness, seizures, weakness and headaches.  Psychiatric/Behavioral: Negative for depression. The patient is not nervous/anxious.    DRUG ALLERGIES:   Allergies  Allergen Reactions  . Cephalosporins Hives  . Ciprofloxacin Hives, Shortness Of Breath and Rash  . Doxycycline Hives  . Streptomycin Hives  . Sulfa Antibiotics Hives  . Amoxicillin Hives  . Atenolol     Other reaction(s): Other (See Comments) Slowed his heart rate.  . Penicillins Hives  . Hydroxychloroquine Rash    Other reaction(s): Other (See Comments) unknown unknown Other reaction(s): Other (See Comments) unknown   VITALS:  Blood pressure 116/68, pulse 70, temperature 97.6 F (36.4 C), resp. rate 20, height 5\' 11"  (1.803 m), weight 74.8 kg, SpO2 92 %. PHYSICAL EXAMINATION:  Physical Exam HENT:     Head: Normocephalic and atraumatic.  Eyes:     Conjunctiva/sclera: Conjunctivae normal.     Pupils:  Pupils are equal, round, and reactive to light.  Neck:     Thyroid: No thyromegaly.     Trachea: No tracheal deviation.  Cardiovascular:     Rate and Rhythm: Normal rate and regular rhythm.     Heart sounds: Normal heart sounds.  Pulmonary:     Effort: Pulmonary effort is normal. No respiratory distress.     Breath sounds: Normal breath sounds. No wheezing.  Chest:     Chest wall: No tenderness.  Abdominal:     General: Bowel sounds are normal. There is no distension.     Palpations: Abdomen is soft.     Tenderness: There is no abdominal tenderness.  Musculoskeletal:        General: Normal range of motion.     Cervical back: Normal range of motion and neck supple.  Skin:    General: Skin is warm and dry.     Findings: No rash.  Neurological:     Mental Status: He is alert and oriented to person, place, and time.     Cranial Nerves: No cranial nerve deficit.    LABORATORY PANEL:  Male CBC Recent Labs  Lab 12/21/19 0557  WBC 7.8  HGB 11.4*  HCT 34.2*  PLT 123*   ------------------------------------------------------------------------------------------------------------------ Chemistries  Recent Labs  Lab 12/21/19 0557  NA 139  K 5.0  CL 108  CO2 20*  GLUCOSE 168*  BUN 42*  CREATININE 1.53*  CALCIUM 8.7*  MG 2.6*  AST 37  ALT 30  ALKPHOS 73  BILITOT 1.5*   RADIOLOGY:  No results found. ASSESSMENT AND PLAN:  86 year old male with a history of diabetes,  hypertension, bladder cancer followed by urology at [facility], COPD is admitted for acute respiratory failure due to COVID-19  Acute respiratory failure due to COVID-19 pneumonia Continue remdesivir and Decadron date 1/5 Requiring 2 L oxygen via nasal cannula, wean as tolerated Follow inflammatory markers daily. Antitussives, vitamins, albuterol Patient was vaccinated in March 2021  Diabetes Sliding scale insulin  Hypertension Controlled  COPD Stable on home inhalers  Body mass index is 23.01  kg/m.      Status is: Inpatient  Remains inpatient appropriate because:Inpatient level of care appropriate due to severity of illness   Dispo: The patient is from: Home              Anticipated d/c is to: Home              Anticipated d/c date is: > 3 days              Patient currently is not medically stable to d/c.  Needs 4 more days of IV remdesivir    DVT prophylaxis:            enoxaparin (LOVENOX) injection 40 mg Start: 12/20/19 2200     Family Communication:  "discussed with patient"   All the records are reviewed and case discussed with Care Management/Social Worker. Management plans discussed with the patient, nursing and they are in agreement.  CODE STATUS: Full Code  TOTAL TIME TAKING CARE OF THIS PATIENT: 25 minutes.   More than 50% of the time was spent in counseling/coordination of care: YES  POSSIBLE D/C IN 4 DAYS, DEPENDING ON CLINICAL CONDITION.   Max Sane M.D on 12/21/2019 at 1:23 PM  [facility]   CC: Primary care physician; System, Pcp Not In  Note: This dictation was prepared with Dragon dictation along with smaller phrase technology. Any transcriptional errors that result from this process are unintentional.

## 2019-12-22 DIAGNOSIS — C679 Malignant neoplasm of bladder, unspecified: Secondary | ICD-10-CM

## 2019-12-22 LAB — C-REACTIVE PROTEIN: CRP: 11.8 mg/dL — ABNORMAL HIGH (ref <1.0)

## 2019-12-22 LAB — COMPREHENSIVE METABOLIC PANEL
ALT: 69 U/L — ABNORMAL HIGH (ref 0–44)
AST: 70 U/L — ABNORMAL HIGH (ref 15–41)
Albumin: 3.5 g/dL (ref 3.5–5.0)
Alkaline Phosphatase: 72 U/L (ref 38–126)
Anion gap: 10 (ref 5–15)
BUN: 55 mg/dL — ABNORMAL HIGH (ref 8–23)
CO2: 20 mmol/L — ABNORMAL LOW (ref 22–32)
Calcium: 8.8 mg/dL — ABNORMAL LOW (ref 8.9–10.3)
Chloride: 110 mmol/L (ref 98–111)
Creatinine, Ser: 1.61 mg/dL — ABNORMAL HIGH (ref 0.61–1.24)
GFR calc Af Amer: 44 mL/min — ABNORMAL LOW (ref >60)
GFR calc non Af Amer: 38 mL/min — ABNORMAL LOW (ref >60)
Glucose, Bld: 182 mg/dL — ABNORMAL HIGH (ref 70–99)
Potassium: 4.6 mmol/L (ref 3.5–5.1)
Sodium: 140 mmol/L (ref 135–145)
Total Bilirubin: 0.8 mg/dL (ref 0.3–1.2)
Total Protein: 7.1 g/dL (ref 6.5–8.1)

## 2019-12-22 LAB — CBC WITH DIFFERENTIAL/PLATELET
Abs Immature Granulocytes: 0.1 10*3/uL — ABNORMAL HIGH (ref 0.00–0.07)
Basophils Absolute: 0 10*3/uL (ref 0.0–0.1)
Basophils Relative: 0 %
Eosinophils Absolute: 0 10*3/uL (ref 0.0–0.5)
Eosinophils Relative: 0 %
HCT: 35.1 % — ABNORMAL LOW (ref 39.0–52.0)
Hemoglobin: 11.4 g/dL — ABNORMAL LOW (ref 13.0–17.0)
Immature Granulocytes: 1 %
Lymphocytes Relative: 13 %
Lymphs Abs: 1 10*3/uL (ref 0.7–4.0)
MCH: 29.8 pg (ref 26.0–34.0)
MCHC: 32.5 g/dL (ref 30.0–36.0)
MCV: 91.6 fL (ref 80.0–100.0)
Monocytes Absolute: 0.5 10*3/uL (ref 0.1–1.0)
Monocytes Relative: 7 %
Neutro Abs: 5.9 10*3/uL (ref 1.7–7.7)
Neutrophils Relative %: 79 %
Platelets: 147 10*3/uL — ABNORMAL LOW (ref 150–400)
RBC: 3.83 MIL/uL — ABNORMAL LOW (ref 4.22–5.81)
RDW: 14.4 % (ref 11.5–15.5)
Smear Review: NORMAL
WBC: 7.5 10*3/uL (ref 4.0–10.5)
nRBC: 0 % (ref 0.0–0.2)

## 2019-12-22 LAB — PHOSPHORUS: Phosphorus: 4.2 mg/dL (ref 2.5–4.6)

## 2019-12-22 LAB — GLUCOSE, CAPILLARY
Glucose-Capillary: 146 mg/dL — ABNORMAL HIGH (ref 70–99)
Glucose-Capillary: 216 mg/dL — ABNORMAL HIGH (ref 70–99)
Glucose-Capillary: 100 mg/dL — ABNORMAL HIGH (ref 70–99)
Glucose-Capillary: 85 mg/dL (ref 70–99)

## 2019-12-22 LAB — FERRITIN: Ferritin: 780 ng/mL — ABNORMAL HIGH (ref 24–336)

## 2019-12-22 LAB — FIBRIN DERIVATIVES D-DIMER (ARMC ONLY): Fibrin derivatives D-dimer (ARMC): 2067.26 ng/mL (FEU) — ABNORMAL HIGH (ref 0.00–499.00)

## 2019-12-22 LAB — MAGNESIUM: Magnesium: 2.8 mg/dL — ABNORMAL HIGH (ref 1.7–2.4)

## 2019-12-22 NOTE — Progress Notes (Signed)
Pt refused proning. Says he can't do it, RN tried to assist, pt denied.

## 2019-12-22 NOTE — Progress Notes (Signed)
Scheduled meds given. Pt denies any further needs at this time. Call bell within reach

## 2019-12-22 NOTE — Plan of Care (Signed)

## 2019-12-22 NOTE — Progress Notes (Signed)
Pt layinmg in bed. Pt encouraged to do flutter valve and incentive spirometer, pt denies using these recently. Pt denies any pain. Pt asked for cold ice water, RN called tech. Tech will bring water in a few minutes. Pt given scheduled meds. Pt denies anyu further needs at this time. Call bell within reach. RN number on pt's board.

## 2019-12-22 NOTE — Progress Notes (Addendum)
[name] at [place] NAME: Jared Parker    MR#:  [id number]  DATE OF BIRTH:  1931/08/30  SUBJECTIVE:  CHIEF COMPLAINT:  No chief complaint on file. No new issues, cough present REVIEW OF SYSTEMS:  Review of Systems  Constitutional: Negative for diaphoresis, fever, malaise/fatigue and weight loss.  HENT: Negative for ear discharge, ear pain, hearing loss, nosebleeds, sore throat and tinnitus.   Eyes: Negative for blurred vision and pain.  Respiratory: Positive for cough and shortness of breath. Negative for hemoptysis and wheezing.   Cardiovascular: Negative for chest pain, palpitations, orthopnea and leg swelling.  Gastrointestinal: Negative for abdominal pain, blood in stool, constipation, diarrhea, heartburn, nausea and vomiting.  Genitourinary: Negative for dysuria, frequency and urgency.  Musculoskeletal: Negative for back pain and myalgias.  Skin: Negative for itching and rash.  Neurological: Negative for dizziness, tingling, tremors, focal weakness, seizures, weakness and headaches.  Psychiatric/Behavioral: Negative for depression. The patient is not nervous/anxious.    DRUG ALLERGIES:   Allergies  Allergen Reactions  . Cephalosporins Hives  . Ciprofloxacin Hives, Shortness Of Breath and Rash  . Doxycycline Hives  . Streptomycin Hives  . Sulfa Antibiotics Hives  . Amoxicillin Hives  . Atenolol     Other reaction(s): Other (See Comments) Slowed his heart rate.  . Penicillins Hives  . Hydroxychloroquine Rash    Other reaction(s): Other (See Comments) unknown unknown Other reaction(s): Other (See Comments) unknown   VITALS:  Blood pressure (!) 118/57, pulse (!) 59, temperature 98 F (36.7 C), temperature source Oral, resp. rate 16, height 5\' 11"  (1.803 m), weight 74.8 kg, SpO2 93 %. PHYSICAL EXAMINATION:  Physical Exam HENT:     Head: Normocephalic and atraumatic.  Eyes:     Conjunctiva/sclera: Conjunctivae normal.      Pupils: Pupils are equal, round, and reactive to light.  Neck:     Thyroid: No thyromegaly.     Trachea: No tracheal deviation.  Cardiovascular:     Rate and Rhythm: Normal rate and regular rhythm.     Heart sounds: Normal heart sounds.  Pulmonary:     Effort: Pulmonary effort is normal. No respiratory distress.     Breath sounds: Normal breath sounds. No wheezing.  Chest:     Chest wall: No tenderness.  Abdominal:     General: Bowel sounds are normal. There is no distension.     Palpations: Abdomen is soft.     Tenderness: There is no abdominal tenderness.  Musculoskeletal:        General: Normal range of motion.     Cervical back: Normal range of motion and neck supple.  Skin:    General: Skin is warm and dry.     Findings: No rash.  Neurological:     Mental Status: He is alert and oriented to person, place, and time.     Cranial Nerves: No cranial nerve deficit.    LABORATORY PANEL:  Male CBC Recent Labs  Lab 12/22/19 0646  WBC 7.5  HGB 11.4*  HCT 35.1*  PLT 147*   ------------------------------------------------------------------------------------------------------------------ Chemistries  Recent Labs  Lab 12/22/19 0646  NA 140  K 4.6  CL 110  CO2 20*  GLUCOSE 182*  BUN 55*  CREATININE 1.61*  CALCIUM 8.8*  MG 2.8*  AST 70*  ALT 69*  ALKPHOS 72  BILITOT 0.8   RADIOLOGY:  No results found. ASSESSMENT AND PLAN:  86 year old male with a history of  diabetes, hypertension, bladder cancer followed by urology at [facility], COPD is admitted for acute respiratory failure due to COVID-19  Acute respiratory failure due to COVID-19 pneumonia Continue remdesivir day 2/5, stop steroids as he is on room air On room air now Follow inflammatory markers daily. Antitussives, vitamins, albuterol Patient was vaccinated in March 2021  Diabetes Sliding scale insulin  Hypertension Controlled  COPD Stable on home inhalers  Body mass index is 23.01 kg/m.       Status is: Inpatient  Remains inpatient appropriate because:Inpatient level of care appropriate due to severity of illness   Dispo: The patient is from: Home              Anticipated d/c is to: Home              Anticipated d/c date is: 3 days              Patient currently is not medically stable to d/c.  Needs 3 more days of IV remdesivir    DVT prophylaxis:            enoxaparin (LOVENOX) injection 40 mg Start: 12/20/19 2200     Family Communication:  "discussed with patient"   All the records are reviewed and case discussed with Care Management/Social Worker. Management plans discussed with the patient, nursing and they are in agreement.  CODE STATUS: DNR  TOTAL TIME TAKING CARE OF THIS PATIENT: 25 minutes.   More than 50% of the time was spent in counseling/coordination of care: YES  POSSIBLE D/C IN 3 DAYS, DEPENDING ON CLINICAL CONDITION.   Max Sane M.D on 12/22/2019 at 1:14 PM  [facility]   CC: Primary care physician; System, Pcp Not In  Note: This dictation was prepared with Dragon dictation along with smaller phrase technology. Any transcriptional errors that result from this process are unintentional.

## 2019-12-23 LAB — COMPREHENSIVE METABOLIC PANEL
ALT: 55 U/L — ABNORMAL HIGH (ref 0–44)
AST: 41 U/L (ref 15–41)
Albumin: 3.4 g/dL — ABNORMAL LOW (ref 3.5–5.0)
Alkaline Phosphatase: 62 U/L (ref 38–126)
Anion gap: 8 (ref 5–15)
BUN: 59 mg/dL — ABNORMAL HIGH (ref 8–23)
CO2: 20 mmol/L — ABNORMAL LOW (ref 22–32)
Calcium: 8.6 mg/dL — ABNORMAL LOW (ref 8.9–10.3)
Chloride: 112 mmol/L — ABNORMAL HIGH (ref 98–111)
Creatinine, Ser: 1.42 mg/dL — ABNORMAL HIGH (ref 0.61–1.24)
GFR calc Af Amer: 51 mL/min — ABNORMAL LOW (ref >60)
GFR calc non Af Amer: 44 mL/min — ABNORMAL LOW (ref >60)
Glucose, Bld: 87 mg/dL (ref 70–99)
Potassium: 4.3 mmol/L (ref 3.5–5.1)
Sodium: 140 mmol/L (ref 135–145)
Total Bilirubin: 0.8 mg/dL (ref 0.3–1.2)
Total Protein: 6.7 g/dL (ref 6.5–8.1)

## 2019-12-23 LAB — CBC WITH DIFFERENTIAL/PLATELET
Abs Immature Granulocytes: 0.11 10*3/uL — ABNORMAL HIGH (ref 0.00–0.07)
Basophils Absolute: 0 10*3/uL (ref 0.0–0.1)
Basophils Relative: 0 %
Eosinophils Absolute: 0 10*3/uL (ref 0.0–0.5)
Eosinophils Relative: 0 %
HCT: 33.8 % — ABNORMAL LOW (ref 39.0–52.0)
Hemoglobin: 11.1 g/dL — ABNORMAL LOW (ref 13.0–17.0)
Immature Granulocytes: 1 %
Lymphocytes Relative: 10 %
Lymphs Abs: 1.2 10*3/uL (ref 0.7–4.0)
MCH: 30.2 pg (ref 26.0–34.0)
MCHC: 32.8 g/dL (ref 30.0–36.0)
MCV: 91.8 fL (ref 80.0–100.0)
Monocytes Absolute: 1.2 10*3/uL — ABNORMAL HIGH (ref 0.1–1.0)
Monocytes Relative: 10 %
Neutro Abs: 9.8 10*3/uL — ABNORMAL HIGH (ref 1.7–7.7)
Neutrophils Relative %: 79 %
Platelets: 174 10*3/uL (ref 150–400)
RBC: 3.68 MIL/uL — ABNORMAL LOW (ref 4.22–5.81)
RDW: 14.4 % (ref 11.5–15.5)
WBC: 12.3 10*3/uL — ABNORMAL HIGH (ref 4.0–10.5)
nRBC: 0 % (ref 0.0–0.2)

## 2019-12-23 LAB — MAGNESIUM: Magnesium: 2.5 mg/dL — ABNORMAL HIGH (ref 1.7–2.4)

## 2019-12-23 LAB — FIBRIN DERIVATIVES D-DIMER (ARMC ONLY): Fibrin derivatives D-dimer (ARMC): 1818.06 ng/mL (FEU) — ABNORMAL HIGH (ref 0.00–499.00)

## 2019-12-23 LAB — C-REACTIVE PROTEIN: CRP: 6.9 mg/dL — ABNORMAL HIGH (ref <1.0)

## 2019-12-23 LAB — GLUCOSE, CAPILLARY
Glucose-Capillary: 95 mg/dL (ref 70–99)
Glucose-Capillary: 141 mg/dL — ABNORMAL HIGH (ref 70–99)
Glucose-Capillary: 111 mg/dL — ABNORMAL HIGH (ref 70–99)
Glucose-Capillary: 211 mg/dL — ABNORMAL HIGH (ref 70–99)

## 2019-12-23 LAB — PHOSPHORUS: Phosphorus: 2.8 mg/dL (ref 2.5–4.6)

## 2019-12-23 LAB — FERRITIN: Ferritin: 524 ng/mL — ABNORMAL HIGH (ref 24–336)

## 2019-12-23 MED ORDER — DEXAMETHASONE 4 MG PO TABS
6.0000 mg | ORAL_TABLET | Freq: Every day | ORAL | Status: DC
  Administered 2019-12-23 – 2019-12-25 (×3): 6 mg via ORAL
  Filled 2019-12-23 (×3): qty 2

## 2019-12-23 NOTE — Progress Notes (Signed)
PROGRESS NOTE    Jared Parker  SJG:[id number] DOB: [birth year] DOA: 12/20/2019 PCP: System, Pcp Not In   Brief Narrative: Taken from H&P ELMON SHADER is a 86 y.o. male with medical history significant for  DM, HTN, bladder cancer s/p TURBT followed by urology at [facility], COPD, who presents to the emergency room with a 1 week history of cough congestion weakness, shortness of breath with cough as well as decreased appetite and loss of taste for food.  Denies Covid contacts.  Vaccinated for Covid in March 2021.  Found to be positive for COVID-19 and chest x-ray consistent with COVID-19 pneumonia.  He was started on remdesivir and Decadron for borderline hypoxia with exertion.  Subjective: Patient continued to feel very weak and lethargic.  No other complaint.  Assessment & Plan:   Principal Problem:   Acute respiratory failure due to COVID-19 [facility]) Active Problems:   Type II diabetes mellitus ([place])   Essential hypertension   Pneumonia due to COVID-19 virus   COPD with chronic bronchitis ([place])   Malignant neoplasm of bladder ([place])  Acute hypoxic respiratory failure secondary to COVID-19 pneumonia. Breakthrough infection as he was vaccinated in March 2021. Currently saturating in low 90s on room air.  Acutely elevated inflammatory markers which now trending down. -Continue remdesivir and steroids-day 3. -Continue supportive care. -Continue to monitor inflammatory markers. -Ambulate with pulse ox  History of bladder cancer.  No acute concern.  Being followed up as an outpatient.  Palliative care was also consulted and patient would like to treat treatable and transition to DNR status.  Diabetes mellitus.  CBG within goal. -Continue SSI. -Continue Tradjenta. -Continue Levemir 6 units twice daily. -Continue mealtime coverage for 3 units.  Hypertension.  Blood pressure within goal. Not on any antihypertensives. -Continue to monitor.  COPD.  Currently stable, no  wheezing. -Continue home bronchodilators.   Objective: Vitals:   12/23/19 0511 12/23/19 0856 12/23/19 1206 12/23/19 1635  BP: (!) 116/57 (!) 131/53 (!) 121/47 (!) 134/52  Pulse: (!) 51 (!) 55 63 64  Resp: 16 18 18 18   Temp: 98.5 F (36.9 C) 97.8 F (36.6 C) 98.7 F (37.1 C) 97.8 F (36.6 C)  TempSrc: Oral Oral Oral Oral  SpO2: 91% (!) 89% 92% 92%  Weight:      Height:        Intake/Output Summary (Last 24 hours) at 12/23/2019 1640 Last data filed at 12/23/2019 1637 Gross per 24 hour  Intake 200 ml  Output 800 ml  Net -600 ml   Filed Weights   12/20/19 1154 12/21/19 0527  Weight: 78 kg 74.8 kg    Examination:  General exam: Appears calm and comfortable  Respiratory system: Clear to auscultation. Respiratory effort normal. Cardiovascular system: S1 & S2 heard, RRR. No JVD, murmurs, Gastrointestinal system: Soft, nontender, nondistended, bowel sounds positive. Central nervous system: Alert and oriented. No focal neurological deficits. Extremities: No edema, no cyanosis, pulses intact and symmetrical. Psychiatry: Judgement and insight appear normal.   DVT prophylaxis: Lovenox Code Status: DNR Family Communication: Son was updated on phone. Disposition Plan:  Status is: Inpatient  Remains inpatient appropriate because:Inpatient level of care appropriate due to severity of illness   Dispo: The patient is from: Home              Anticipated d/c is to: Home              Anticipated d/c date is: 2 days  Patient currently is not medically stable to d/c.   Consultants:   None  Procedures:  Antimicrobials:   Data Reviewed: I have personally reviewed following labs and imaging studies  CBC: Recent Labs  Lab 12/20/19 1201 12/20/19 2236 12/21/19 0557 12/22/19 0646 12/23/19 0509  WBC 7.8 6.9 7.8 7.5 12.3*  NEUTROABS 5.6  --  6.5 5.9 9.8*  HGB 12.0* 10.8* 11.4* 11.4* 11.1*  HCT 35.7* 32.9* 34.2* 35.1* 33.8*  MCV 89.7 91.6 89.8 91.6 91.8  PLT 125*  123* 123* 147* 833   Basic Metabolic Panel: Recent Labs  Lab 12/20/19 1201 12/20/19 2236 12/21/19 0557 12/22/19 0646 12/23/19 0509  NA 139  --  139 140 140  K 4.1  --  5.0 4.6 4.3  CL 107  --  108 110 112*  CO2 19*  --  20* 20* 20*  GLUCOSE 123*  --  168* 182* 87  BUN 37*  --  42* 55* 59*  CREATININE 1.57* 1.42* 1.53* 1.61* 1.42*  CALCIUM 8.8*  --  8.7* 8.8* 8.6*  MG  --   --  2.6* 2.8* 2.5*  PHOS  --   --  4.2 4.2 2.8   GFR: Estimated Creatinine Clearance: 39.5 mL/min (A) (by C-G formula based on SCr of 1.42 mg/dL (H)). Liver Function Tests: Recent Labs  Lab 12/20/19 1201 12/21/19 0557 12/22/19 0646 12/23/19 0509  AST 36 37 70* 41  ALT 24 30 69* 55*  ALKPHOS 67 73 72 62  BILITOT 1.7* 1.5* 0.8 0.8  PROT 7.5 7.4 7.1 6.7  ALBUMIN 3.7 3.7 3.5 3.4*   No results for input(s): LIPASE, AMYLASE in the last 168 hours. No results for input(s): AMMONIA in the last 168 hours. Coagulation Profile: No results for input(s): INR, PROTIME in the last 168 hours. Cardiac Enzymes: No results for input(s): CKTOTAL, CKMB, CKMBINDEX, TROPONINI in the last 168 hours. BNP (last 3 results) No results for input(s): PROBNP in the last 8760 hours. HbA1C: No results for input(s): HGBA1C in the last 72 hours. CBG: Recent Labs  Lab 12/22/19 1611 12/22/19 2148 12/23/19 0853 12/23/19 1204 12/23/19 1634  GLUCAP 100* 85 95 141* 111*   Lipid Profile: No results for input(s): CHOL, HDL, LDLCALC, TRIG, CHOLHDL, LDLDIRECT in the last 72 hours. Thyroid Function Tests: No results for input(s): TSH, T4TOTAL, FREET4, T3FREE, THYROIDAB in the last 72 hours. Anemia Panel: Recent Labs    12/22/19 0646 12/23/19 0509  FERRITIN 780* 524*   Sepsis Labs: Recent Labs  Lab 12/20/19 1201  PROCALCITON <0.10  LATICACIDVEN 1.0    Recent Results (from the past 240 hour(s))  SARS Coronavirus 2 by RT PCR (hospital order, performed in [facility] hospital lab) Nasopharyngeal Nasopharyngeal Swab      Status: Abnormal   Collection Time: 12/20/19  5:48 PM   Specimen: Nasopharyngeal Swab  Result Value Ref Range Status   SARS Coronavirus 2 POSITIVE (A) NEGATIVE Final    Comment: RESULT CALLED TO, READ BACK BY AND VERIFIED WITH: DEE MCCLAIN @1847  12/20/19 MJU (NOTE) SARS-CoV-2 target nucleic acids are DETECTED  SARS-CoV-2 RNA is generally detectable in upper respiratory specimens  during the acute phase of infection.  Positive results are indicative  of the presence of the identified virus, but do not rule out bacterial infection or co-infection with other pathogens not detected by the test.  Clinical correlation with patient history and  other diagnostic information is necessary to determine patient infection status.  The expected result is negative.  Fact Sheet  for Patients:   StrictlyIdeas.no   Fact Sheet for Healthcare Providers:   BankingDealers.co.za    This test is not yet approved or cleared by the [place] FDA and  has been authorized for detection and/or diagnosis of SARS-CoV-2 by FDA under an Emergency Use Authorization (EUA).  This EUA will remain in effect (meaning this test can  be used) for the duration of  the COVID-19 declaration under Section 564(b)(1) of the Act, 21 U.S.C. section 360-bbb-3(b)(1), unless the authorization is terminated or revoked sooner.  Performed at [facility], [address] [place], [place] [address]      Radiology Studies: No results found.  Scheduled Meds: . albuterol  2 puff Inhalation Q6H  . allopurinol  100 mg Oral Daily  . vitamin C  500 mg Oral Daily  . aspirin EC  81 mg Oral Daily  . dexamethasone  6 mg Oral Daily  . dorzolamide  1 drop Both Eyes TID  . DULoxetine  20 mg Oral Daily  . enoxaparin (LOVENOX) injection  40 mg Subcutaneous Q24H  . insulin aspart  0-5 Units Subcutaneous QHS  . insulin aspart  0-9 Units Subcutaneous TID WC  . insulin aspart  3 Units  Subcutaneous TID WC  . insulin detemir  6 Units Subcutaneous BID  . linagliptin  5 mg Oral Daily  . mirtazapine  30 mg Oral QHS  . multivitamin with minerals  1 tablet Oral Daily  . simvastatin  10 mg Oral QHS  . sodium chloride flush  3 mL Intravenous Once  . zinc sulfate  220 mg Oral Daily   Continuous Infusions: . remdesivir 100 mg in NS 100 mL 100 mg (12/23/19 1100)     LOS: 3 days   Time spent: 40 minutes  Lorella Nimrod, MD [facility]  If 7PM-7AM, please contact night-coverage [email or web address]  12/23/2019, 4:40 PM   This record has been created using Systems analyst. Errors have been sought and corrected,but may not always be located. Such creation errors do not reflect on the standard of care.

## 2019-12-24 LAB — COMPREHENSIVE METABOLIC PANEL
ALT: 44 U/L (ref 0–44)
AST: 23 U/L (ref 15–41)
Albumin: 3.4 g/dL — ABNORMAL LOW (ref 3.5–5.0)
Alkaline Phosphatase: 72 U/L (ref 38–126)
Anion gap: 7 (ref 5–15)
BUN: 50 mg/dL — ABNORMAL HIGH (ref 8–23)
CO2: 22 mmol/L (ref 22–32)
Calcium: 9 mg/dL (ref 8.9–10.3)
Chloride: 112 mmol/L — ABNORMAL HIGH (ref 98–111)
Creatinine, Ser: 1.27 mg/dL — ABNORMAL HIGH (ref 0.61–1.24)
GFR calc Af Amer: 59 mL/min — ABNORMAL LOW (ref >60)
GFR calc non Af Amer: 51 mL/min — ABNORMAL LOW (ref >60)
Glucose, Bld: 150 mg/dL — ABNORMAL HIGH (ref 70–99)
Potassium: 5.4 mmol/L — ABNORMAL HIGH (ref 3.5–5.1)
Sodium: 141 mmol/L (ref 135–145)
Total Bilirubin: 0.9 mg/dL (ref 0.3–1.2)
Total Protein: 6.8 g/dL (ref 6.5–8.1)

## 2019-12-24 LAB — GLUCOSE, CAPILLARY
Glucose-Capillary: 118 mg/dL — ABNORMAL HIGH (ref 70–99)
Glucose-Capillary: 157 mg/dL — ABNORMAL HIGH (ref 70–99)
Glucose-Capillary: 156 mg/dL — ABNORMAL HIGH (ref 70–99)
Glucose-Capillary: 157 mg/dL — ABNORMAL HIGH (ref 70–99)

## 2019-12-24 LAB — CBC WITH DIFFERENTIAL/PLATELET
Abs Immature Granulocytes: 0.07 10*3/uL (ref 0.00–0.07)
Basophils Absolute: 0 10*3/uL (ref 0.0–0.1)
Basophils Relative: 0 %
Eosinophils Absolute: 0 10*3/uL (ref 0.0–0.5)
Eosinophils Relative: 0 %
HCT: 33.9 % — ABNORMAL LOW (ref 39.0–52.0)
Hemoglobin: 11.4 g/dL — ABNORMAL LOW (ref 13.0–17.0)
Immature Granulocytes: 1 %
Lymphocytes Relative: 9 %
Lymphs Abs: 0.6 10*3/uL — ABNORMAL LOW (ref 0.7–4.0)
MCH: 30.2 pg (ref 26.0–34.0)
MCHC: 33.6 g/dL (ref 30.0–36.0)
MCV: 89.9 fL (ref 80.0–100.0)
Monocytes Absolute: 0.5 10*3/uL (ref 0.1–1.0)
Monocytes Relative: 6 %
Neutro Abs: 6.2 10*3/uL (ref 1.7–7.7)
Neutrophils Relative %: 84 %
Platelets: 170 10*3/uL (ref 150–400)
RBC: 3.77 MIL/uL — ABNORMAL LOW (ref 4.22–5.81)
RDW: 14.5 % (ref 11.5–15.5)
WBC: 7.3 10*3/uL (ref 4.0–10.5)
nRBC: 0 % (ref 0.0–0.2)

## 2019-12-24 LAB — FIBRIN DERIVATIVES D-DIMER (ARMC ONLY): Fibrin derivatives D-dimer (ARMC): 1927.47 ng/mL (FEU) — ABNORMAL HIGH (ref 0.00–499.00)

## 2019-12-24 LAB — PHOSPHORUS: Phosphorus: 3.8 mg/dL (ref 2.5–4.6)

## 2019-12-24 LAB — C-REACTIVE PROTEIN: CRP: 7.3 mg/dL — ABNORMAL HIGH (ref <1.0)

## 2019-12-24 LAB — FERRITIN: Ferritin: 454 ng/mL — ABNORMAL HIGH (ref 24–336)

## 2019-12-24 LAB — MAGNESIUM: Magnesium: 2.6 mg/dL — ABNORMAL HIGH (ref 1.7–2.4)

## 2019-12-24 LAB — PROCALCITONIN: Procalcitonin: <0.1 ng/mL

## 2019-12-24 MED ORDER — SODIUM ZIRCONIUM CYCLOSILICATE 10 G PO PACK
10.0000 g | PACK | Freq: Once | ORAL | Status: AC
  Administered 2019-12-24: 10:00:00 10 g via ORAL
  Filled 2019-12-24: qty 1

## 2019-12-24 MED ORDER — FUROSEMIDE 20 MG PO TABS
20.0000 mg | ORAL_TABLET | Freq: Every day | ORAL | Status: DC
  Administered 2019-12-24 – 2019-12-25 (×2): 20 mg via ORAL
  Filled 2019-12-24 (×2): qty 1

## 2019-12-24 NOTE — Plan of Care (Signed)
PMT note:  Unable to reach patient on phone as it was busy. Patient appears to be improving. Will continue to shadow.

## 2019-12-24 NOTE — Progress Notes (Signed)
PROGRESS NOTE    Jared Parker  NKN:[id number] DOB: 1932/01/27 DOA: 12/20/2019 PCP: System, Pcp Not In   Brief Narrative: Taken from H&P Jared Parker is a 86 y.o. male with medical history significant for  DM, HTN, bladder cancer s/p TURBT followed by urology at [facility], COPD, who presents to the emergency room with a 1 week history of cough congestion weakness, shortness of breath with cough as well as decreased appetite and loss of taste for food.  Denies Covid contacts.  Vaccinated for Covid in March 2021.  Found to be positive for COVID-19 and chest x-ray consistent with COVID-19 pneumonia.  He was started on remdesivir and Decadron for borderline hypoxia with exertion.  Subjective: Per patient I guess I am little better today.  No shortness of breath or chest pain.  Assessment & Plan:   Principal Problem:   Acute respiratory failure due to COVID-19 [facility]) Active Problems:   Type II diabetes mellitus ([place])   Essential hypertension   Pneumonia due to COVID-19 virus   COPD with chronic bronchitis ([place])   Malignant neoplasm of bladder ([place])  Acute hypoxic respiratory failure secondary to COVID-19 pneumonia. Breakthrough infection as he was vaccinated in March 2021. Currently saturating in low 90s on room air. Elevated inflammatory markers with a upward trend today.  Clinically seems improving. -Continue remdesivir and steroids-day 4. -Continue supportive care. -Continue to monitor inflammatory markers. -Ambulate with pulse ox -Recheck procalcitonin. -PT/OT evaluation.  Hyperkalemia.  Potassium mildly elevated at 5.4.  Remained asymptomatic.  Patient was on Lasix at home. -Give him one-time dose of Lokelma. -Restart home Lasix -Continue to monitor.  CKD stage IIIa.  Creatinine appears to be at baseline. -Continue to monitor. -Avoid nephrotoxins.  History of bladder cancer.  No acute concern.  Being followed up as an outpatient.  Palliative care was also consulted and patient  would like to treat treatable and transition to DNR status.  Diabetes mellitus.  CBG within goal. -Continue SSI. -Continue Tradjenta. -Continue Levemir 6 units twice daily. -Continue mealtime coverage for 3 units.  Hypertension.  Blood pressure within goal. Holding home dose of losartan and nifedipine-can be restarted as needed. -Restarting home dose of Lasix today. -Continue to monitor.  COPD.  Currently stable, no wheezing. -Continue home bronchodilators.   Objective: Vitals:   12/24/19 0028 12/24/19 0443 12/24/19 0743 12/24/19 1233  BP: (!) 141/51 (!) 131/55 (!) 135/57 (!) 132/50  Pulse: (!) 51 (!) 46 (!) 48 72  Resp: 16 16 20 20   Temp: (!) 97.5 F (36.4 C) 97.8 F (36.6 C) 98.3 F (36.8 C) 98.5 F (36.9 C)  TempSrc: Oral Oral Oral Oral  SpO2: 91% 94% 93% 96%  Weight:      Height:        Intake/Output Summary (Last 24 hours) at 12/24/2019 1351 Last data filed at 12/24/2019 0743 Gross per 24 hour  Intake 200 ml  Output 950 ml  Net -750 ml   Filed Weights   12/20/19 1154 12/21/19 0527  Weight: 78 kg 74.8 kg    Examination:  General.  Well-developed elderly man, in no acute distress. Pulmonary.  Lungs clear bilaterally, normal respiratory effort. CV.  Regular rate and rhythm, no JVD, rub or murmur. Abdomen.  Soft, nontender, nondistended, BS positive. CNS.  Alert and oriented x3.  No focal neurologic deficit. Extremities.  No edema, no cyanosis, pulses intact and symmetrical. Psychiatry.  Judgment and insight appears normal.  DVT prophylaxis: Lovenox Code Status: DNR Family Communication: Called  son with no response. Disposition Plan:  Status is: Inpatient  Remains inpatient appropriate because:Inpatient level of care appropriate due to severity of illness   Dispo: The patient is from: Home              Anticipated d/c is to: Home              Anticipated d/c date is: 1 day.              Patient currently is not medically stable to  d/c.   Consultants:   None  Procedures:  Antimicrobials:   Data Reviewed: I have personally reviewed following labs and imaging studies  CBC: Recent Labs  Lab 12/20/19 1201 12/20/19 1201 12/20/19 2236 12/21/19 0557 12/22/19 0646 12/23/19 0509 12/24/19 0545  WBC 7.8   < > 6.9 7.8 7.5 12.3* 7.3  NEUTROABS 5.6  --   --  6.5 5.9 9.8* 6.2  HGB 12.0*   < > 10.8* 11.4* 11.4* 11.1* 11.4*  HCT 35.7*   < > 32.9* 34.2* 35.1* 33.8* 33.9*  MCV 89.7   < > 91.6 89.8 91.6 91.8 89.9  PLT 125*   < > 123* 123* 147* 174 170   < > = values in this interval not displayed.   Basic Metabolic Panel: Recent Labs  Lab 12/20/19 1201 12/20/19 1201 12/20/19 2236 12/21/19 0557 12/22/19 0646 12/23/19 0509 12/24/19 0545  NA 139  --   --  139 140 140 141  K 4.1  --   --  5.0 4.6 4.3 5.4*  CL 107  --   --  108 110 112* 112*  CO2 19*  --   --  20* 20* 20* 22  GLUCOSE 123*  --   --  168* 182* 87 150*  BUN 37*  --   --  42* 55* 59* 50*  CREATININE 1.57*   < > 1.42* 1.53* 1.61* 1.42* 1.27*  CALCIUM 8.8*  --   --  8.7* 8.8* 8.6* 9.0  MG  --   --   --  2.6* 2.8* 2.5* 2.6*  PHOS  --   --   --  4.2 4.2 2.8 3.8   < > = values in this interval not displayed.   GFR: Estimated Creatinine Clearance: 44.2 mL/min (A) (by C-G formula based on SCr of 1.27 mg/dL (H)). Liver Function Tests: Recent Labs  Lab 12/20/19 1201 12/21/19 0557 12/22/19 0646 12/23/19 0509 12/24/19 0545  AST 36 37 70* 41 23  ALT 24 30 69* 55* 44  ALKPHOS 67 73 72 62 72  BILITOT 1.7* 1.5* 0.8 0.8 0.9  PROT 7.5 7.4 7.1 6.7 6.8  ALBUMIN 3.7 3.7 3.5 3.4* 3.4*   No results for input(s): LIPASE, AMYLASE in the last 168 hours. No results for input(s): AMMONIA in the last 168 hours. Coagulation Profile: No results for input(s): INR, PROTIME in the last 168 hours. Cardiac Enzymes: No results for input(s): CKTOTAL, CKMB, CKMBINDEX, TROPONINI in the last 168 hours. BNP (last 3 results) No results for input(s): PROBNP in the last  8760 hours. HbA1C: No results for input(s): HGBA1C in the last 72 hours. CBG: Recent Labs  Lab 12/23/19 1204 12/23/19 1634 12/23/19 2019 12/24/19 0738 12/24/19 1235  GLUCAP 141* 111* 211* 118* 157*   Lipid Profile: No results for input(s): CHOL, HDL, LDLCALC, TRIG, CHOLHDL, LDLDIRECT in the last 72 hours. Thyroid Function Tests: No results for input(s): TSH, T4TOTAL, FREET4, T3FREE, THYROIDAB in the last 72 hours. Anemia Panel:  Recent Labs    12/23/19 0509 12/24/19 0545  FERRITIN 524* 454*   Sepsis Labs: Recent Labs  Lab 12/20/19 1201  PROCALCITON <0.10  LATICACIDVEN 1.0    Recent Results (from the past 240 hour(s))  SARS Coronavirus 2 by RT PCR (hospital order, performed in [facility] hospital lab) Nasopharyngeal Nasopharyngeal Swab     Status: Abnormal   Collection Time: 12/20/19  5:48 PM   Specimen: Nasopharyngeal Swab  Result Value Ref Range Status   SARS Coronavirus 2 POSITIVE (A) NEGATIVE Final    Comment: RESULT CALLED TO, READ BACK BY AND VERIFIED WITH: DEE MCCLAIN @1847  12/20/19 MJU (NOTE) SARS-CoV-2 target nucleic acids are DETECTED  SARS-CoV-2 RNA is generally detectable in upper respiratory specimens  during the acute phase of infection.  Positive results are indicative  of the presence of the identified virus, but do not rule out bacterial infection or co-infection with other pathogens not detected by the test.  Clinical correlation with patient history and  other diagnostic information is necessary to determine patient infection status.  The expected result is negative.  Fact Sheet for Patients:   StrictlyIdeas.no   Fact Sheet for Healthcare Providers:   BankingDealers.co.za    This test is not yet approved or cleared by the [place] FDA and  has been authorized for detection and/or diagnosis of SARS-CoV-2 by FDA under an Emergency Use Authorization (EUA).  This EUA will remain in effect  (meaning this test can  be used) for the duration of  the COVID-19 declaration under Section 564(b)(1) of the Act, 21 U.S.C. section 360-bbb-3(b)(1), unless the authorization is terminated or revoked sooner.  Performed at [facility], [address], [place], [place] [address]      Radiology Studies: No results found.  Scheduled Meds: . albuterol  2 puff Inhalation Q6H  . allopurinol  100 mg Oral Daily  . vitamin C  500 mg Oral Daily  . aspirin EC  81 mg Oral Daily  . dexamethasone  6 mg Oral Daily  . dorzolamide  1 drop Both Eyes TID  . DULoxetine  20 mg Oral Daily  . enoxaparin (LOVENOX) injection  40 mg Subcutaneous Q24H  . furosemide  20 mg Oral Daily  . insulin aspart  0-5 Units Subcutaneous QHS  . insulin aspart  0-9 Units Subcutaneous TID WC  . insulin aspart  3 Units Subcutaneous TID WC  . insulin detemir  6 Units Subcutaneous BID  . linagliptin  5 mg Oral Daily  . mirtazapine  30 mg Oral QHS  . multivitamin with minerals  1 tablet Oral Daily  . simvastatin  10 mg Oral QHS  . sodium chloride flush  3 mL Intravenous Once  . zinc sulfate  220 mg Oral Daily   Continuous Infusions: . remdesivir 100 mg in NS 100 mL 100 mg (12/24/19 0929)     LOS: 4 days   Time spent: 35 minutes  Lorella Nimrod, MD [facility]  If 7PM-7AM, please contact night-coverage [email or web address]  12/24/2019, 1:51 PM   This record has been created using Systems analyst. Errors have been sought and corrected,but may not always be located. Such creation errors do not reflect on the standard of care.

## 2019-12-24 NOTE — Evaluation (Signed)
Occupational Therapy Evaluation Patient Details Name: Jared Parker MRN: [id number] DOB: [birth year] Today's Date: 12/24/2019    History of Present Illness 86 y.o. male with medical history significant for  DM, HTN, bladder cancer s/p TURBT followed by urology at [facility], COPD, who presents to the emergency room with a 1 week history of cough congestion weakness, shortness of breath with cough as well as decreased appetite and loss of taste for food.  Denies Covid contacts.  Vaccinated for Covid in March 2021.  Found to be positive for COVID-19 and chest x-ray consistent with COVID-19 pneumonia.  He was started on remdesivir and Decadron for borderline hypoxia with exertion.   Clinical Impression   Patient presenting with decreasing I in self care, balance, functional mobility, transfers, endurance, and self care.  Patient reports being living alone PTA. His son is nearby and is able to provide some assist per pt report. Pt does not use AD at home for independence. Patient currently functioning at supervision - CGA without use of AD . Pt does fatigue quickly but was motivated for skilled OT intervention. Patient will benefit from acute OT to increase overall independence in the areas of ADLs, functional mobility, and safety awareness in order to safely discharge home.    Follow Up Recommendations  Home health OT;Supervision - Intermittent    Equipment Recommendations  3 in 1 bedside commode    Recommendations for Other Services Other (comment) (none at this time)     Precautions / Restrictions Precautions Precautions: Fall      Mobility Bed Mobility Overal bed mobility: Needs Assistance Bed Mobility: Rolling;Supine to Sit Rolling: Supervision   Supine to sit: Supervision     General bed mobility comments: increased time and min cuing for technique and hand placement  Transfers Overall transfer level: Needs assistance Equipment used: 1 person hand held assist Transfers: Sit to/from  Stand Sit to Stand: Min guard         General transfer comment: min guard for balance    Balance Overall balance assessment: Needs assistance Sitting-balance support: Feet supported;No upper extremity supported Sitting balance-Leahy Scale: Normal     Standing balance support: During functional activity Standing balance-Leahy Scale: Fair Standing balance comment: use of UE support needed         ADL either performed or assessed with clinical judgement   ADL Overall ADL's : Needs assistance/impaired     Grooming: Wash/dry hands;Wash/dry face;Oral care;Standing;Min guard       Lower Body Dressing: Sitting/lateral leans;Min guard       General ADL Comments: pt needing overall min guard for balance while standing for self care tasks at sink without use of AD this session.     Vision Baseline Vision/History: Wears glasses Wears Glasses: At all times Patient Visual Report: No change from baseline Vision Assessment?: No apparent visual deficits            Pertinent Vitals/Pain Pain Assessment: No/denies pain     Hand Dominance Right   Extremity/Trunk Assessment Upper Extremity Assessment Upper Extremity Assessment: Generalized weakness   Lower Extremity Assessment Lower Extremity Assessment: Generalized weakness       Communication Communication Communication: No difficulties   Cognition Arousal/Alertness: Awake/alert Behavior During Therapy: WFL for tasks assessed/performed Overall Cognitive Status: Within Functional Limits for tasks assessed                      Home Living Family/patient expects to be discharged to:: Private residence Living Arrangements: Children  Available Help at Discharge: Available PRN/intermittently;Family Type of Home: House Home Access: Stairs to enter [employer] of Steps: 3-4 Entrance Stairs-Rails: Left Home Layout: One level     Bathroom Shower/Tub: Teacher, early years/pre:  Standard Bathroom Accessibility: Yes How Accessible: Accessible via walker Home Equipment: [place] - 2 wheels;Cane - single point;Shower seat          Prior Functioning/Environment Level of Independence: Independent with assistive device(s)        Comments: Pt reports he does not use AD for mobility but has it if needed        OT Problem List: Decreased strength;Decreased activity tolerance;Decreased safety awareness;Impaired balance (sitting and/or standing)      OT Treatment/Interventions: Self-care/ADL training;Therapeutic exercise;Therapeutic activities;Energy conservation;DME and/or AE instruction;Balance training;Patient/family education    OT Goals(Current goals can be found in the care plan section) Acute Rehab OT Goals Patient Stated Goal: to go home OT Goal Formulation: With patient Time For Goal Achievement: 01/07/20 Potential to Achieve Goals: Good ADL Goals Pt Will Perform Grooming: with modified independence;standing Pt Will Perform Upper Body Bathing: with modified independence Pt Will Perform Lower Body Bathing: with modified independence Pt Will Transfer to Toilet: with modified independence;ambulating Pt Will Perform Toileting - Clothing Manipulation and hygiene: with modified independence;sit to/from stand  OT Frequency: Min 1X/week   Barriers to D/C: Other (comment)  none known at this time          AM-PAC OT "6 Clicks" Daily Activity     Outcome Measure Help from another person eating meals?: None Help from another person taking care of personal grooming?: A Little Help from another person toileting, which includes using toliet, bedpan, or urinal?: A Little Help from another person bathing (including washing, rinsing, drying)?: A Little Help from another person to put on and taking off regular upper body clothing?: A Little Help from another person to put on and taking off regular lower body clothing?: A Little 6 Click Score: 19   End of Session  Equipment Utilized During Treatment: Oxygen (Pt with potassium value of 5.4 with RN confirming okay to see and pt wanting to work with therapy) Nurse Communication: Mobility status;Other (comment) (Pt found on 3L via [place] in room. OT decreased Oxygen to 2 L with self care tasks and Pt's O2 remaining 93-94% saturation. RN notified and she reports, " Why was he on oxygen? He should be on RA. I will reassess.)  Activity Tolerance: Patient tolerated treatment well Patient left: in bed;with call bell/phone within reach;with bed alarm set  OT Visit Diagnosis: Unsteadiness on feet (R26.81);Muscle weakness (generalized) (M62.81)                Time: 8563-1497 OT Time Calculation (min): 26 min Charges:  OT General Charges $OT Visit: 1 Visit OT Evaluation $OT Eval Low Complexity: 1 Low OT Treatments $Self Care/Home Management : 8-22 mins  Darleen Crocker, MS, OTR/L , CBIS ascom [phone]  12/24/19, 3:51 PM

## 2019-12-24 NOTE — Care Management Important Message (Signed)
Important Message  Patient Details  Name: Jared Parker MRN: [id number] Date of Birth: 1931-06-15   Medicare Important Message Given:  Yes     Shelbie Hutching, RN 12/24/2019, 3:17 PM

## 2019-12-25 LAB — D-DIMER, QUANTITATIVE: D-Dimer, Quant: 2.78 ug/mL-FEU — ABNORMAL HIGH (ref 0.00–0.50)

## 2019-12-25 LAB — CBC WITH DIFFERENTIAL/PLATELET
Abs Immature Granulocytes: 0.17 10*3/uL — ABNORMAL HIGH (ref 0.00–0.07)
Basophils Absolute: 0 10*3/uL (ref 0.0–0.1)
Basophils Relative: 0 %
Eosinophils Absolute: 0 10*3/uL (ref 0.0–0.5)
Eosinophils Relative: 0 %
HCT: 35.4 % — ABNORMAL LOW (ref 39.0–52.0)
Hemoglobin: 11.7 g/dL — ABNORMAL LOW (ref 13.0–17.0)
Immature Granulocytes: 1 %
Lymphocytes Relative: 8 %
Lymphs Abs: 1 10*3/uL (ref 0.7–4.0)
MCH: 30 pg (ref 26.0–34.0)
MCHC: 33.1 g/dL (ref 30.0–36.0)
MCV: 90.8 fL (ref 80.0–100.0)
Monocytes Absolute: 1.2 10*3/uL — ABNORMAL HIGH (ref 0.1–1.0)
Monocytes Relative: 9 %
Neutro Abs: 9.9 10*3/uL — ABNORMAL HIGH (ref 1.7–7.7)
Neutrophils Relative %: 82 %
Platelets: 199 10*3/uL (ref 150–400)
RBC: 3.9 MIL/uL — ABNORMAL LOW (ref 4.22–5.81)
RDW: 14.5 % (ref 11.5–15.5)
WBC: 12.3 10*3/uL — ABNORMAL HIGH (ref 4.0–10.5)
nRBC: 0 % (ref 0.0–0.2)

## 2019-12-25 LAB — COMPREHENSIVE METABOLIC PANEL
ALT: 42 U/L (ref 0–44)
AST: 22 U/L (ref 15–41)
Albumin: 3.5 g/dL (ref 3.5–5.0)
Alkaline Phosphatase: 67 U/L (ref 38–126)
Anion gap: 11 (ref 5–15)
BUN: 50 mg/dL — ABNORMAL HIGH (ref 8–23)
CO2: 20 mmol/L — ABNORMAL LOW (ref 22–32)
Calcium: 9.1 mg/dL (ref 8.9–10.3)
Chloride: 110 mmol/L (ref 98–111)
Creatinine, Ser: 1.32 mg/dL — ABNORMAL HIGH (ref 0.61–1.24)
GFR calc Af Amer: 56 mL/min — ABNORMAL LOW (ref >60)
GFR calc non Af Amer: 49 mL/min — ABNORMAL LOW (ref >60)
Glucose, Bld: 121 mg/dL — ABNORMAL HIGH (ref 70–99)
Potassium: 4.4 mmol/L (ref 3.5–5.1)
Sodium: 141 mmol/L (ref 135–145)
Total Bilirubin: 0.9 mg/dL (ref 0.3–1.2)
Total Protein: 7.2 g/dL (ref 6.5–8.1)

## 2019-12-25 LAB — FERRITIN: Ferritin: 509 ng/mL — ABNORMAL HIGH (ref 24–336)

## 2019-12-25 LAB — PHOSPHORUS: Phosphorus: 3.5 mg/dL (ref 2.5–4.6)

## 2019-12-25 LAB — MAGNESIUM: Magnesium: 2.4 mg/dL (ref 1.7–2.4)

## 2019-12-25 LAB — C-REACTIVE PROTEIN: CRP: 4.4 mg/dL — ABNORMAL HIGH (ref <1.0)

## 2019-12-25 LAB — GLUCOSE, CAPILLARY
Glucose-Capillary: 115 mg/dL — ABNORMAL HIGH (ref 70–99)
Glucose-Capillary: 112 mg/dL — ABNORMAL HIGH (ref 70–99)

## 2019-12-25 MED ORDER — ASCORBIC ACID 500 MG PO TABS: 500.0000 mg | ORAL_TABLET | Freq: Every day | ORAL | 0 refills | Status: AC

## 2019-12-25 MED ORDER — POLYETHYLENE GLYCOL 3350 17 G PO PACK
17.0000 g | PACK | Freq: Every day | ORAL | Status: DC
  Administered 2019-12-25: 11:00:00 17 g via ORAL
  Filled 2019-12-25: qty 1

## 2019-12-25 MED ORDER — ADULT MULTIVITAMIN W/MINERALS CH: 1.0000 | ORAL_TABLET | Freq: Every day | ORAL | 0 refills | Status: AC

## 2019-12-25 MED ORDER — POLYETHYLENE GLYCOL 3350 17 G PO PACK: 17.0000 g | PACK | Freq: Every day | ORAL | 0 refills | Status: AC | PRN

## 2019-12-25 MED ORDER — GUAIFENESIN-DM 100-10 MG/5ML PO SYRP: 10.0000 mL | ORAL_SOLUTION | ORAL | 0 refills | Status: DC | PRN

## 2019-12-25 MED ORDER — ZINC SULFATE 220 (50 ZN) MG PO CAPS: 220.0000 mg | ORAL_CAPSULE | Freq: Every day | ORAL | 0 refills | Status: AC

## 2019-12-25 NOTE — Progress Notes (Signed)
Discharged paper went over with the pt. Pt. Verbalized understanding. IV taken out. Waiting for family to pick him up.

## 2019-12-25 NOTE — Progress Notes (Signed)
Pt. Left the unit via W/C at 0420.

## 2019-12-25 NOTE — Discharge Summary (Signed)
Physician Discharge Summary  Jared Parker ASN:[id number] DOB: 12/10/1931 DOA: 12/20/2019  PCP: System, Pcp Not In  Admit date: 12/20/2019 Discharge date: 12/25/2019  Admitted From: Home Disposition:  Home  Recommendations for Outpatient Follow-up:  1. Follow up with PCP in 1-2 weeks 2. Please obtain BMP/CBC in one week 3. Please follow up on the following pending results:None  Home Health:Yes Equipment/Devices:Home O2, walker Discharge Condition: Fair- Patient is high risk for death and deterioration. CODE STATUS: DNR Diet recommendation: Heart Healthy / Carb Modified   Brief/Interim Summary: Jared Bagnell Bucklandis a 86 y.o.malewith medical history significant forDM, HTN, bladder cancers/p TURBTfollow[facility], COPD, who presents to the emergency room with a 1 week history of cough congestion weakness, shortness of breath with coughas well as decreased appetite and loss of taste for food. Denies Covid contacts. Vaccinated for Covid in March 2021.  Found to be positive for COVID-19 and chest x-ray consistent with COVID-19 pneumonia.  He was started on remdesivir and Decadron for borderline hypoxia with exertion.  Completed the course of remdesivir.  Does not require oxygen after day 1.  He do become desaturated up to high 80s with ambulation with quick resolution of hypoxia.  Procalcitonin remain negative.  Dmitriy markers were elevated, started improving before discharge.  Palliative care was also consulted due to his multiple comorbidities and history of cancer.  Patient would like to treat treatable.  Home health services ordered for his physical deconditioning.  Patient will continue with rest of her home meds and follow-up with his providers.  Discharge Diagnoses:  Principal Problem:   Acute respiratory failure due to COVID-19 [facility]) Active Problems:   Type II diabetes mellitus ([place])   Essential hypertension   Pneumonia due to COVID-19 virus   COPD with chronic  bronchitis ([place])   Malignant neoplasm of bladder [facility])   Discharge Instructions  Discharge Instructions    Diet - low sodium heart healthy   Complete by: As directed    Discharge instructions   Complete by: As directed    It was pleasure taking care of you. Please continue taking your supplement. Keep yourself hydrated. You are being discharge with some oxygen only with ambulation at this time, please use 2L whenever you are moving around. Follow up with your primary care provider.   Increase activity slowly   Complete by: As directed      Allergies as of 12/25/2019      Reactions   Cephalosporins Hives   Ciprofloxacin Hives, Shortness Of Breath, Rash   Doxycycline Hives   Streptomycin Hives   Sulfa Antibiotics Hives   Amoxicillin Hives   Atenolol    Other reaction(s): Other (See Comments) Slowed his heart rate.   Penicillins Hives   Hydroxychloroquine Rash   Other reaction(s): Other (See Comments) unknown unknown Other reaction(s): Other (See Comments) unknown      Medication List    STOP taking these medications   NIFEdipine 30 MG 24 hr tablet Commonly known as: PROCARDIA-XL/NIFEDICAL-XL     TAKE these medications   allopurinol 100 MG tablet Commonly known as: ZYLOPRIM Take 200 mg by mouth daily.   ascorbic acid 500 MG tablet Commonly known as: VITAMIN C Take 1 tablet (500 mg total) by mouth daily.   aspirin EC 81 MG tablet Take 81 mg by mouth daily.   docusate sodium 100 MG capsule Commonly known as: COLACE Take 100 mg by mouth daily as needed for mild constipation.   dorzolamide 2 % ophthalmic  solution Commonly known as: TRUSOPT Place 1 drop into both eyes 3 (three) times daily.   Doxepin HCl 3 MG Tabs Take 3 mg by mouth at bedtime.   DULoxetine 20 MG capsule Commonly known as: CYMBALTA Take 20 mg by mouth daily.   Fish Oil 1000 MG Caps Take 1,000 mg by mouth daily.   fluticasone 110 MCG/ACT inhaler Commonly known as: FLOVENT  HFA Inhale 2 puffs into the lungs 2 (two) times daily.   furosemide 20 MG tablet Commonly known as: LASIX Take 20 mg by mouth daily.   guaiFENesin-dextromethorphan 100-10 MG/5ML syrup Commonly known as: ROBITUSSIN DM Take 10 mLs by mouth every 4 (four) hours as needed for cough.   losartan 50 MG tablet Commonly known as: COZAAR Take 50 mg by mouth daily.   mirtazapine 30 MG tablet Commonly known as: REMERON Take 30 mg by mouth at bedtime.   multivitamin with minerals Tabs tablet Take 1 tablet by mouth daily.   omeprazole 20 MG capsule Commonly known as: PRILOSEC Take 20 mg by mouth daily.   oxyCODONE 5 MG immediate release tablet Commonly known as: Oxy IR/ROXICODONE Take 5-10 mg by mouth every 6 (six) hours as needed for moderate pain or severe pain.   polyethylene glycol 17 g packet Commonly known as: MIRALAX / GLYCOLAX Take 17 g by mouth daily as needed for mild constipation or moderate constipation.   simvastatin 10 MG tablet Commonly known as: ZOCOR Take 10 mg by mouth daily.   terazosin 5 MG capsule Commonly known as: HYTRIN Take 5 mg by mouth at bedtime.   zinc sulfate 220 (50 Zn) MG capsule Take 1 capsule (220 mg total) by mouth daily.            Durable Medical Equipment  (From admission, onward)         Start     Ordered   12/25/19 1154  For home use only DME oxygen  Once       Comments: 2L with ambulation  Question Answer Comment  Length of Need 6 Months   Mode or (Route) Nasal cannula   Liters per Minute 2   Frequency Continuous (stationary and portable oxygen unit needed)   Oxygen conserving device Yes   Oxygen delivery system Gas      12/25/19 1154          Allergies  Allergen Reactions  . Cephalosporins Hives  . Ciprofloxacin Hives, Shortness Of Breath and Rash  . Doxycycline Hives  . Streptomycin Hives  . Sulfa Antibiotics Hives  . Amoxicillin Hives  . Atenolol     Other reaction(s): Other (See Comments) Slowed his heart  rate.  . Penicillins Hives  . Hydroxychloroquine Rash    Other reaction(s): Other (See Comments) unknown unknown Other reaction(s): Other (See Comments) unknown    Consultations:  None  Procedures/Studies: DG Chest 2 View  Result Date: 12/20/2019 CLINICAL DATA:  Cough EXAM: CHEST - 2 VIEW COMPARISON:  03/25/2015 chest radiograph and prior. FINDINGS: There are new heterogenous/ground-glass opacities involving the right greater than left bilateral mid lung and bases. No pneumothorax or pleural effusion. Cardiomediastinal silhouette within normal limits. Aortic atherosclerotic calcifications. No acute osseous abnormality. IMPRESSION: New bilateral heterogenous/ground-glass opacities concerning for multifocal infection. Electronically Signed   By: Primitivo Gauze M.D.   On: 12/20/2019 12:32     Subjective: Patient has no new complaint.  Discharge Exam: Vitals:   12/25/19 0007 12/25/19 0429  BP: 118/72 136/68  Pulse: 60 (!) 51  Resp: 16 16  Temp: 98.1 F (36.7 C) 97.9 F (36.6 C)  SpO2: 93% 95%   Vitals:   12/24/19 1715 12/24/19 1936 12/25/19 0007 12/25/19 0429  BP:  (!) 130/97 118/72 136/68  Pulse:  64 60 (!) 51  Resp:  20 16 16   Temp:  98.1 F (36.7 C) 98.1 F (36.7 C) 97.9 F (36.6 C)  TempSrc:  Oral Oral Oral  SpO2: 94% 98% 93% 95%  Weight:      Height:        General: Pt is alert, awake, not in acute distress Cardiovascular: RRR, S1/S2 +, no rubs, no gallops Respiratory: CTA bilaterally, no wheezing, no rhonchi Abdominal: Soft, NT, ND, bowel sounds + Extremities: no edema, no cyanosis   The results of significant diagnostics from this hospitalization (including imaging, microbiology, ancillary and laboratory) are listed below for reference.    Microbiology: Recent Results (from the past 240 hour(s))  SARS Coronavirus 2 by RT PCR (hospital order, performed in [facility] hospital lab) Nasopharyngeal Nasopharyngeal Swab     Status: Abnormal   Collection  Time: 12/20/19  5:48 PM   Specimen: Nasopharyngeal Swab  Result Value Ref Range Status   SARS Coronavirus 2 POSITIVE (A) NEGATIVE Final    Comment: RESULT CALLED TO, READ BACK BY AND VERIFIED WITH: DEE MCCLAIN @1847  12/20/19 MJU (NOTE) SARS-CoV-2 target nucleic acids are DETECTED  SARS-CoV-2 RNA is generally detectable in upper respiratory specimens  during the acute phase of infection.  Positive results are indicative  of the presence of the identified virus, but do not rule out bacterial infection or co-infection with other pathogens not detected by the test.  Clinical correlation with patient history and  other diagnostic information is necessary to determine patient infection status.  The expected result is negative.  Fact Sheet for Patients:   StrictlyIdeas.no   Fact Sheet for Healthcare Providers:   BankingDealers.co.za    This test is not yet approved or cleared by the [place] FDA and  has been authorized for detection and/or diagnosis of SARS-CoV-2 by FDA under an Emergency Use Authorization (EUA).  This EUA will remain in effect (meaning this test can  be used) for the duration of  the COVID-19 declaration under Section 564(b)(1) of the Act, 21 U.S.C. section 360-bbb-3(b)(1), unless the authorization is terminated or revoked sooner.  Performed at [facility], [address] [place], [place] [address]      Labs: BNP (last 3 results) No results for input(s): BNP in the last 8760 hours. Basic Metabolic Panel: Recent Labs  Lab 12/21/19 0557 12/22/19 0646 12/23/19 0509 12/24/19 0545 12/25/19 0709  NA 139 140 140 141 141  K 5.0 4.6 4.3 5.4* 4.4  CL 108 110 112* 112* 110  CO2 20* 20* 20* 22 20*  GLUCOSE 168* 182* 87 150* 121*  BUN 42* 55* 59* 50* 50*  CREATININE 1.53* 1.61* 1.42* 1.27* 1.32*  CALCIUM 8.7* 8.8* 8.6* 9.0 9.1  MG 2.6* 2.8* 2.5* 2.6* 2.4  PHOS 4.2 4.2 2.8 3.8 3.5   Liver Function  Tests: Recent Labs  Lab 12/21/19 0557 12/22/19 0646 12/23/19 0509 12/24/19 0545 12/25/19 0709  AST 37 70* 41 23 22  ALT 30 69* 55* 44 42  ALKPHOS 73 72 62 72 67  BILITOT 1.5* 0.8 0.8 0.9 0.9  PROT 7.4 7.1 6.7 6.8 7.2  ALBUMIN 3.7 3.5 3.4* 3.4* 3.5   No results for input(s): LIPASE, AMYLASE in the last 168 hours. No results for  input(s): AMMONIA in the last 168 hours. CBC: Recent Labs  Lab 12/21/19 0557 12/22/19 0646 12/23/19 0509 12/24/19 0545 12/25/19 0709  WBC 7.8 7.5 12.3* 7.3 12.3*  NEUTROABS 6.5 5.9 9.8* 6.2 9.9*  HGB 11.4* 11.4* 11.1* 11.4* 11.7*  HCT 34.2* 35.1* 33.8* 33.9* 35.4*  MCV 89.8 91.6 91.8 89.9 90.8  PLT 123* 147* 174 170 199   Cardiac Enzymes: No results for input(s): CKTOTAL, CKMB, CKMBINDEX, TROPONINI in the last 168 hours. BNP: Invalid input(s): POCBNP CBG: Recent Labs  Lab 12/24/19 0738 12/24/19 1235 12/24/19 1608 12/24/19 2154 12/25/19 0749  GLUCAP 118* 157* 156* 157* 115*   D-Dimer No results for input(s): DDIMER in the last 72 hours. Hgb A1c No results for input(s): HGBA1C in the last 72 hours. Lipid Profile No results for input(s): CHOL, HDL, LDLCALC, TRIG, CHOLHDL, LDLDIRECT in the last 72 hours. Thyroid function studies No results for input(s): TSH, T4TOTAL, T3FREE, THYROIDAB in the last 72 hours.  Invalid input(s): FREET3 Anemia work up Recent Labs    12/24/19 0545 12/25/19 0709  FERRITIN 454* 509*   Urinalysis    Component Value Date/Time   COLORURINE YELLOW (A) 12/20/2019 1156   APPEARANCEUR HAZY (A) 12/20/2019 1156   LABSPEC 1.013 12/20/2019 1156   PHURINE 5.0 12/20/2019 1156   GLUCOSEU NEGATIVE 12/20/2019 1156   HGBUR SMALL (A) 12/20/2019 1156   BILIRUBINUR NEGATIVE 12/20/2019 1156   KETONESUR NEGATIVE 12/20/2019 1156   PROTEINUR 100 (A) 12/20/2019 1156   NITRITE NEGATIVE 12/20/2019 [place] 12/20/2019 1156   Sepsis Labs Invalid input(s): PROCALCITONIN,  WBC,   LACTICIDVEN Microbiology Recent Results (from the past 240 hour(s))  SARS Coronavirus 2 by RT PCR (hospital order, performed in [place] hospital lab) Nasopharyngeal Nasopharyngeal Swab     Status: Abnormal   Collection Time: 12/20/19  5:48 PM   Specimen: Nasopharyngeal Swab  Result Value Ref Range Status   SARS Coronavirus 2 POSITIVE (A) NEGATIVE Final    Comment: RESULT CALLED TO, READ BACK BY AND VERIFIED WITH: DEE MCCLAIN @1847  12/20/19 MJU (NOTE) SARS-CoV-2 target nucleic acids are DETECTED  SARS-CoV-2 RNA is generally detectable in upper respiratory specimens  during the acute phase of infection.  Positive results are indicative  of the presence of the identified virus, but do not rule out bacterial infection or co-infection with other pathogens not detected by the test.  Clinical correlation with patient history and  other diagnostic information is necessary to determine patient infection status.  The expected result is negative.  Fact Sheet for Patients:   StrictlyIdeas.no   Fact Sheet for Healthcare Providers:   BankingDealers.co.za    This test is not yet approved or cleared by the [place] FDA and  has been authorized for detection and/or diagnosis of SARS-CoV-2 by FDA under an Emergency Use Authorization (EUA).  This EUA will remain in effect (meaning this test can  be used) for the duration of  the COVID-19 declaration under Section 564(b)(1) of the Act, 21 U.S.C. section 360-bbb-3(b)(1), unless the authorization is terminated or revoked sooner.  Performed at [facility], [address] [place], [place] [address]     Time coordinating discharge: Over 30 minutes  SIGNED:  Lorella Nimrod, MD  [facility] 12/25/2019, 11:59 AM  If 7PM-7AM, please contact night-coverage [email or web address]  This record has been created using Systems analyst. Errors have been sought and  corrected,but may not always be located. Such creation errors do not reflect on the standard of  care.

## 2019-12-25 NOTE — Progress Notes (Signed)
SATURATION QUALIFICATIONS: (This note is used to comply with regulatory documentation for home oxygen)  Patient Saturations on Room Air at Rest = 92%  Patient Saturations on Room Air while Ambulating = 89-90%  Patient Saturations on 0 Liters of oxygen while Ambulating = 0%  Please briefly explain why patient needs home oxygen:

## 2019-12-25 NOTE — Evaluation (Signed)
Physical Therapy Evaluation Patient Details Name: Jared Parker MRN: [id number] DOB: Nov 26, 1931 Today's Date: 12/25/2019   History of Present Illness  86 y.o. male with medical history significant for  DM, HTN, bladder cancer s/p TURBT followed by urology at [facility], COPD, who presents to the emergency room with a 1 week history of cough congestion weakness, shortness of breath with cough as well as decreased appetite and loss of taste for food.  Denies Covid contacts.  Vaccinated for Covid in March 2021.  Found to be positive for COVID-19 and chest x-ray consistent with COVID-19 pneumonia.  He was started on remdesivir and Decadron for borderline hypoxia with exertion.  Clinical Impression  Upon evaluation, patient alert and oriented; follows commands and agreeable to participation with session.  Bilat UE/LE strength and ROM grossly symmetrical and WFL; no focal weakness appreciated. Able to complete bed mobility with sup/mod indep; sit/stand, basic transfers and gait (30') without assist device, min assist.  Demonstrates somewhat choppy stepping pattern/gait performance; intermittently reaching for walls/furniture for external stabilization; mild LOB x2-3 requiring min assist for recovery.  BORG 5/10 after distance, sats 88% on RA requiring approx 2 min for recovery to 90%. Additional gait trial x20' with RW, cga/close sup-improved safety/stability, fluidity and overall energy conservation with use of RW.  Do recommend continued use with all mobility tasks at this time; patient voiced agreement/understanding, reports he has RW in home environment Would benefit from skilled PT to address above deficits and promote optimal return to PLOF.; Recommend transition to [place] upon discharge from acute hospitalization.     Follow Up Recommendations Home health PT    Equipment Recommendations       Recommendations for Other Services       Precautions / Restrictions Precautions Precautions:  Fall Restrictions Weight Bearing Restrictions: No      Mobility  Bed Mobility Overal bed mobility: Needs Assistance Bed Mobility: Supine to Sit Rolling: Modified independent (Device/Increase time)            Transfers Overall transfer level: Needs assistance   Transfers: Sit to/from Stand           General transfer comment: with and without RW, cga for safety; min cuing for hand placement  Ambulation/Gait   Gait Distance (Feet): 40 Feet Assistive device: None       General Gait Details: somewhat choppy stepping pattern/gait performance; intermittently reaching for walls/furniture for external stabilization; mild LOB x2-3 requiring min assist for recovery.  BORG 5/10 after distance, sats 88% on RA requiring approx 2 min for recovery to 90%.  Stairs            Wheelchair Mobility    Modified Rankin (Stroke Patients Only)       Balance Overall balance assessment: Needs assistance Sitting-balance support: No upper extremity supported;Feet supported Sitting balance-Leahy Scale: Good     Standing balance support: No upper extremity supported Standing balance-Leahy Scale: Fair                               Pertinent Vitals/Pain Pain Assessment: No/denies pain    Home Living Family/patient expects to be discharged to:: Private residence Living Arrangements: Children Available Help at Discharge: Available PRN/intermittently;Family Type of Home: House Home Access: Stairs to enter Entrance Stairs-Rails: Left Entrance Stairs-Number of Steps: 3-4 Home Layout: One level Home Equipment: Walker - 2 wheels;Cane - single point;Shower seat      Prior Function Level of Independence:  Independent with assistive device(s)         Comments: Pt reports he does not use AD for mobility but has it if needed     Hand Dominance   Dominant Hand: Right    Extremity/Trunk Assessment   Upper Extremity Assessment Upper Extremity Assessment:  Overall WFL for tasks assessed (grossly 4-/5 throughout; end-range shoulder ROM deficits, age-related)    Lower Extremity Assessment Lower Extremity Assessment: Overall WFL for tasks assessed (grossly 4-/5 throughout; no focal weakness appreciated)       Communication   Communication: HOH  Cognition Arousal/Alertness: Awake/alert Behavior During Therapy: WFL for tasks assessed/performed Overall Cognitive Status: Within Functional Limits for tasks assessed                                        General Comments      Exercises Other Exercises Other Exercises: 32' with RW, cga/close sup-improved safety/stabiliy, fluidity and overall energy conservation with use of RW.  do recommend continued use with all mobility tasks at this time; patient voiced agreement/understanding, reports he has RW in home environment   Assessment/Plan    PT Assessment Patient needs continued PT services  PT Problem List Decreased activity tolerance;Decreased balance;Decreased mobility;Cardiopulmonary status limiting activity;Decreased knowledge of use of DME;Decreased safety awareness       PT Treatment Interventions DME instruction;Gait training;Stair training;Functional mobility training;Therapeutic activities;Therapeutic exercise;Balance training;Patient/family education    PT Goals (Current goals can be found in the Care Plan section)  Acute Rehab PT Goals Patient Stated Goal: to go home PT Goal Formulation: With patient Time For Goal Achievement: 01/08/20 Potential to Achieve Goals: Good    Frequency Min 2X/week   Barriers to discharge        Co-evaluation               AM-PAC PT "6 Clicks" Mobility  Outcome Measure Help needed turning from your back to your side while in a flat bed without using bedrails?: None Help needed moving from lying on your back to sitting on the side of a flat bed without using bedrails?: None Help needed moving to and from a bed to a chair  (including a wheelchair)?: A Little Help needed standing up from a chair using your arms (e.g., wheelchair or bedside chair)?: A Little Help needed to walk in hospital room?: A Little Help needed climbing 3-5 steps with a railing? : A Little 6 Click Score: 20    End of Session Equipment Utilized During Treatment: Gait belt Activity Tolerance: Patient tolerated treatment well Patient left: in chair;with call bell/phone within reach;with chair alarm set Nurse Communication: Mobility status PT Visit Diagnosis: Muscle weakness (generalized) (M62.81);Difficulty in walking, not elsewhere classified (R26.2)    Time: 1130-1155 PT Time Calculation (min) (ACUTE ONLY): 25 min   Charges:   PT Evaluation $PT Eval Moderate Complexity: 1 Mod PT Treatments $Therapeutic Activity: 8-22 mins        Adriauna Campton H. Owens Shark, PT, DPT, NCS 12/25/19, 2:19 PM [phone]

## 2019-12-25 NOTE — TOC Initial Note (Signed)
Transition of Care [facility]) - Initial/Assessment Note    Patient Details  Name: Jared Parker MRN: [id number] Date of Birth: May 29, 1931  Transition of Care [facility] CM/SW Contact:    Shelbie Hutching, RN Phone Number: 12/25/2019, 1:10 PM  Clinical Narrative:                 Patient admitted to the hospital with [place], he is now medically cleared for discharge home today.  Patient will discharge home with home health services RN, PT, and OT through [place].  Tommi Rumps with [facility] accepted the referral and is aware of discharge today.  Patient reports that his son will be coming to pick him up today.  Patient lives alone in [place] and walks with a rollator.  PCP is Durward Parcel at [facility].   Expected Discharge Plan: [place] Barriers to Discharge: No Barriers Identified   Patient Goals and CMS Choice   CMS Medicare.gov Compare Post Acute Care list provided to:: Patient Choice offered to / list presented to : Patient  Expected Discharge Plan and Services Expected Discharge Plan: [place]   Discharge Planning Services: CM Consult Post Acute Care Choice: [place] arrangements for the past 2 months: Mobile Home Expected Discharge Date: 12/25/19                         HH Arranged: RN, PT, OT [place] Agency: [place] ([place]) Date HH Agency Contacted: 12/25/19 Time [place]: 46 Representative spoke with at [place]: [place] Arrangements/Services Living arrangements for the past 2 months: Mobile Home Lives with:: Self Patient language and need for interpreter reviewed:: Yes Do you feel safe going back to the place where you live?: Yes      Need for Family Participation in Patient Care: Yes (Comment) (COVID) Care giver support system in place?: Yes (comment) (son) Current home services: DME (rollator) Criminal Activity/Legal Involvement Pertinent to Current Situation/Hospitalization: No -  Comment as needed  Activities of Daily Living Home Assistive Devices/Equipment: Eyeglasses ADL Screening (condition at time of admission) Patient's cognitive ability adequate to safely complete daily activities?: Yes Is the patient deaf or have difficulty hearing?: No Does the patient have difficulty seeing, even when wearing glasses/contacts?: No Does the patient have difficulty concentrating, remembering, or making decisions?: No Patient able to express need for assistance with ADLs?: Yes Does the patient have difficulty dressing or bathing?: No Independently performs ADLs?: Yes (appropriate for developmental age) Does the patient have difficulty walking or climbing stairs?: No Weakness of Legs: None Weakness of Arms/Hands: None  Permission Sought/Granted Permission sought to share information with : Case Manager, Other (comment) Permission granted to share information with : Yes, Verbal Permission Granted     Permission granted to share info w AGENCY: [facility]        Emotional Assessment   Attitude/Demeanor/Rapport: Engaged Affect (typically observed): Accepting Orientation: : Oriented to Self, Oriented to Place, Oriented to  Time, Oriented to Situation Alcohol / Substance Use: Not Applicable Psych Involvement: No (comment)  Admission diagnosis:  Hypoxia [R09.02] Pneumonia due to COVID-19 virus [U07.1, J12.82] COVID-19 [U07.1] Patient Active Problem List   Diagnosis Date Noted  . Pneumonia due to COVID-19 virus 12/20/2019  . Acute respiratory failure due to COVID-19 ([place]) 12/20/2019  . COPD with chronic bronchitis ([place]) 12/20/2019  . Type II diabetes mellitus ([place])   . Essential hypertension   .  Hyperlipidemia   . Malignant neoplasm of bladder ([place]) 06/01/2011   PCP:  System, Pcp Not In Pharmacy:   [id number] - [place], [place] [address] Phone: [phone] Fax: [phone]     Social Determinants of Health  (SDOH) Interventions    Readmission Risk Interventions No flowsheet data found.

## 2020-01-29 ENCOUNTER — Other Ambulatory Visit: Payer: Self-pay | Admitting: Internal Medicine

## 2020-01-29 DIAGNOSIS — R600 Localized edema: Secondary | ICD-10-CM

## 2020-01-29 DIAGNOSIS — I10 Essential (primary) hypertension: Secondary | ICD-10-CM

## 2020-02-14 ENCOUNTER — Other Ambulatory Visit: Payer: Self-pay

## 2020-02-14 ENCOUNTER — Ambulatory Visit
Admission: RE | Admit: 2020-02-14 | Discharge: 2020-02-14 | Disposition: A | Discharge status: Home / Self Care | Payer: Medicare HMO | Attending: Internal Medicine | Admitting: Internal Medicine

## 2020-02-14 DIAGNOSIS — Z8616 Personal history of COVID-19: Secondary | ICD-10-CM | POA: Insufficient documentation

## 2020-02-14 DIAGNOSIS — E785 Hyperlipidemia, unspecified: Secondary | ICD-10-CM | POA: Diagnosis not present

## 2020-02-14 DIAGNOSIS — R600 Localized edema: Secondary | ICD-10-CM | POA: Diagnosis not present

## 2020-02-14 DIAGNOSIS — E119 Type 2 diabetes mellitus without complications: Secondary | ICD-10-CM | POA: Insufficient documentation

## 2020-02-14 DIAGNOSIS — Z87891 Personal history of nicotine dependence: Secondary | ICD-10-CM | POA: Insufficient documentation

## 2020-02-14 DIAGNOSIS — I10 Essential (primary) hypertension: Secondary | ICD-10-CM | POA: Diagnosis not present

## 2020-02-14 LAB — ECHOCARDIOGRAM COMPLETE
AR max vel: 2.32 cm2
AV Area VTI: 2.38 cm2
AV Area mean vel: 2.26 cm2
AV Mean grad: 5 mmHg
AV Peak grad: 10.4 mmHg
Ao pk vel: 1.61 m/s
Area-P 1/2: 3.46 cm2
S' Lateral: 3.16 cm

## 2020-02-14 NOTE — Progress Notes (Signed)
*  PRELIMINARY RESULTS* Echocardiogram 2D Echocardiogram has been performed.  Jared Parker 02/14/2020, 11:40 AM

## 2020-12-04 ENCOUNTER — Other Ambulatory Visit: Payer: Self-pay | Admitting: Urology

## 2020-12-04 DIAGNOSIS — R109 Unspecified abdominal pain: Secondary | ICD-10-CM

## 2020-12-23 ENCOUNTER — Ambulatory Visit
Admission: RE | Admit: 2020-12-23 | Discharge: 2020-12-23 | Disposition: A | Discharge status: Home / Self Care | Payer: Medicare HMO | Source: Ambulatory Visit | Attending: Urology | Admitting: Urology

## 2020-12-23 ENCOUNTER — Other Ambulatory Visit: Payer: Self-pay

## 2020-12-23 DIAGNOSIS — R109 Unspecified abdominal pain: Secondary | ICD-10-CM | POA: Diagnosis present

## 2021-03-19 DIAGNOSIS — F3342 Major depressive disorder, recurrent, in full remission: Secondary | ICD-10-CM | POA: Diagnosis present

## 2021-09-14 ENCOUNTER — Encounter: Payer: Self-pay | Admitting: Podiatry

## 2021-09-14 ENCOUNTER — Ambulatory Visit (INDEPENDENT_AMBULATORY_CARE_PROVIDER_SITE_OTHER): Payer: Medicare HMO | Admitting: Podiatry

## 2021-09-14 DIAGNOSIS — B351 Tinea unguium: Secondary | ICD-10-CM | POA: Diagnosis not present

## 2021-09-14 DIAGNOSIS — E119 Type 2 diabetes mellitus without complications: Secondary | ICD-10-CM

## 2021-09-14 DIAGNOSIS — M79675 Pain in left toe(s): Secondary | ICD-10-CM

## 2021-09-14 DIAGNOSIS — M79674 Pain in right toe(s): Secondary | ICD-10-CM | POA: Diagnosis not present

## 2021-09-15 NOTE — Progress Notes (Signed)
?  Subjective:  ?Patient ID: Jared Parker, male    DOB: 1932-04-12,  MRN: [id number] ? ?Chief Complaint  ?Patient presents with  ? Nail Problem  ?    NP R great toe needs nail removed its blue and sore - diabetic  ? ? ?88 y.o. male presents with the above complaint. History confirmed with patient. The right great toenail seems to be loose the other nails are elongated and thickened he has difficulty cutting them ? ?Objective:  ?Physical Exam: ?warm, good capillary refill, no trophic changes or ulcerative lesions, normal DP and PT pulses, and normal sensory exam. ?Left Foot: dystrophic yellowed discolored nail plates with subungual debris ?Right Foot: dystrophic yellowed discolored nail plates with subungual debris and distal one third of hallux nail is lysed from the nail bed ? ?Assessment:  ? ?1. Pain due to onychomycosis of toenails of both feet   ?2. Type 2 diabetes mellitus without complication, without long-term current use of insulin ([place])   ? ? ? ?Plan:  ?Patient was evaluated and treated and all questions answered. ? ?Patient educated on diabetes. Discussed proper diabetic foot care and discussed risks and complications of disease. Educated patient in depth on reasons to return to the office immediately should he/she discover anything concerning or new on the feet. All questions answered. Discussed proper shoes as well.  ? ?Discussed the etiology and treatment options for the condition in detail with the patient. Educated patient on the topical and oral treatment options for mycotic nails. Recommended debridement of the nails today. Sharp and mechanical debridement performed of all painful and mycotic nails today. Nails debrided in length and thickness using a nail nipper to level of comfort. Discussed treatment options including appropriate shoe gear. Follow up as needed for painful nails. ? ? ?I debrided the right hallux nail of the loose portion today.  He did not require total avulsion.  Advised he may  still become looser and if it does he will return to see me for avulsion if needed. ? ?Return in about 3 months (around 12/15/2021) for at risk diabetic foot care.  ? ?

## 2021-10-16 IMAGING — US US RENAL
1 series · 14 of 25 positions shown · non-contrast
Comparison: None.

CLINICAL DATA: Left flank pain for the past 3 weeks.

EXAM:
RENAL / URINARY TRACT ULTRASOUND COMPLETE

[Series 1: us renal · 0.28mm/px · 14 of 34 slices shown]
[im 1/34]
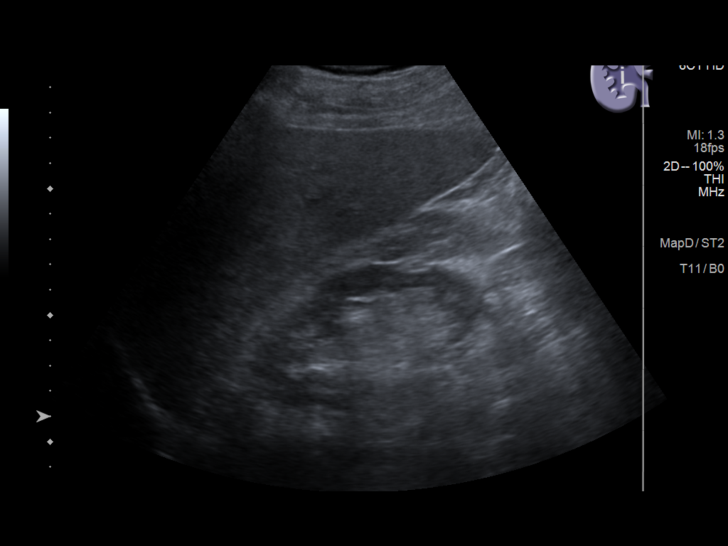
[im 3/34]
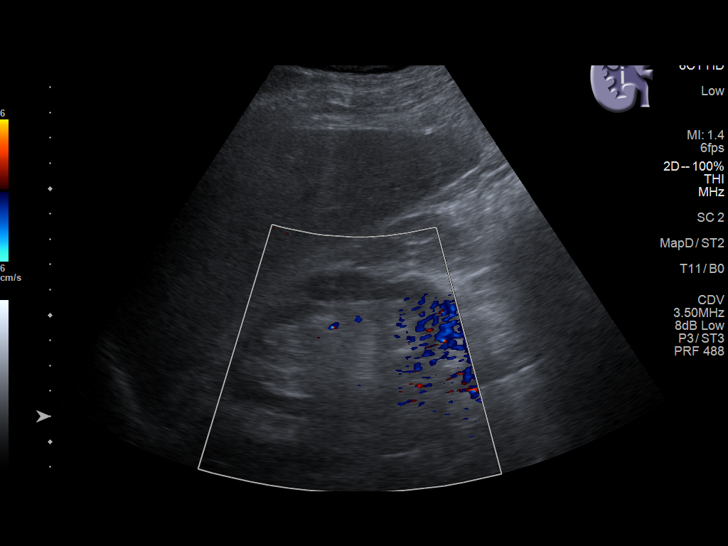
[im 6/34]
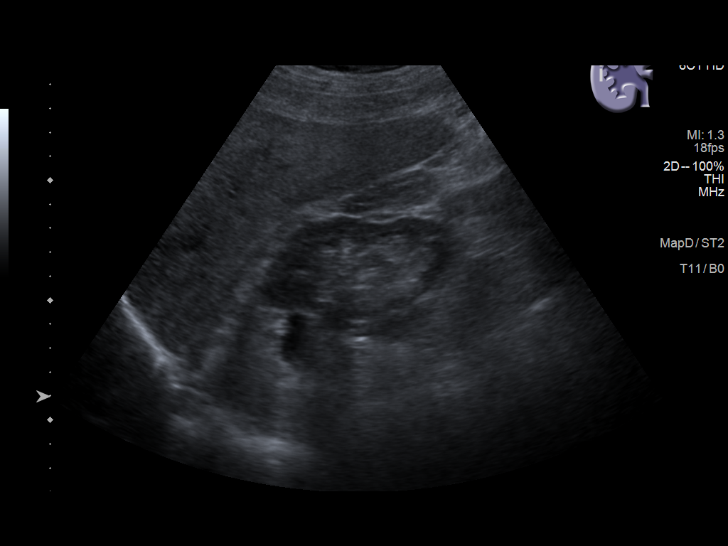
[im 9/34]
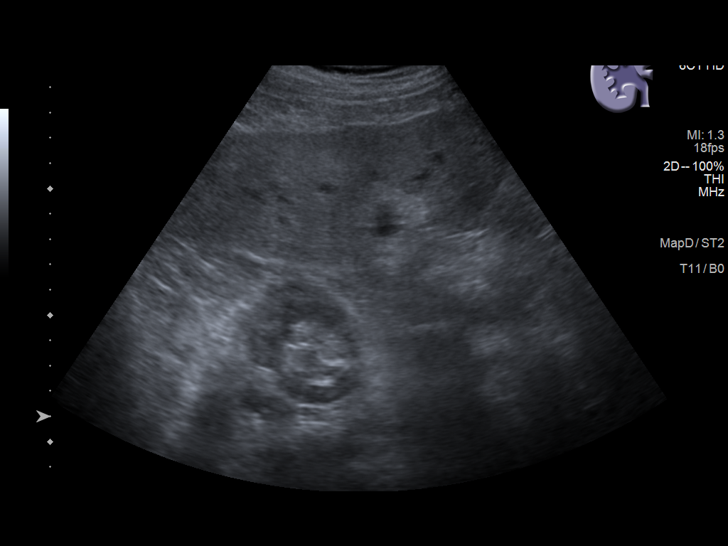
[im 12/34]
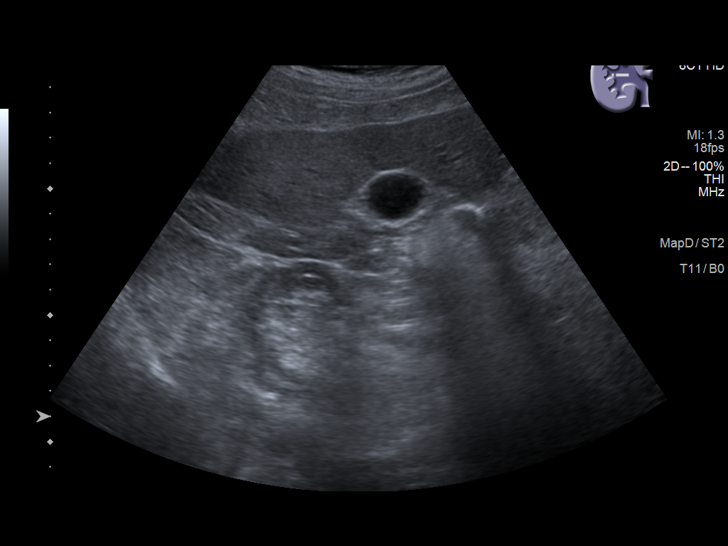
[im 13/34]
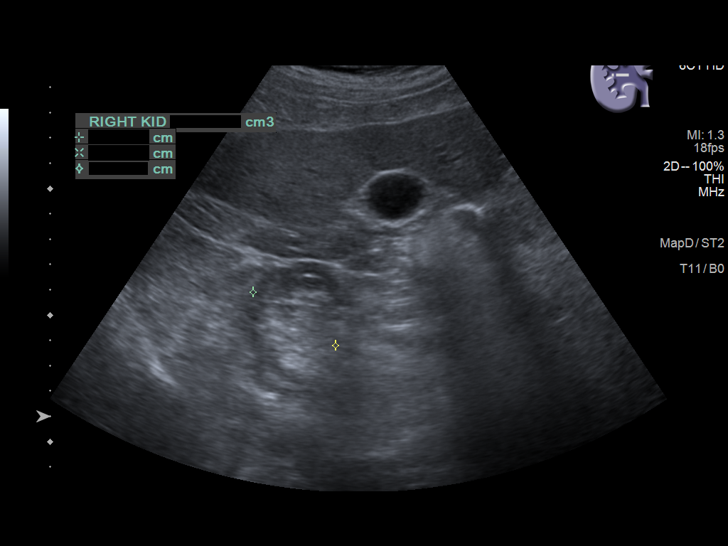
[im 16/34]
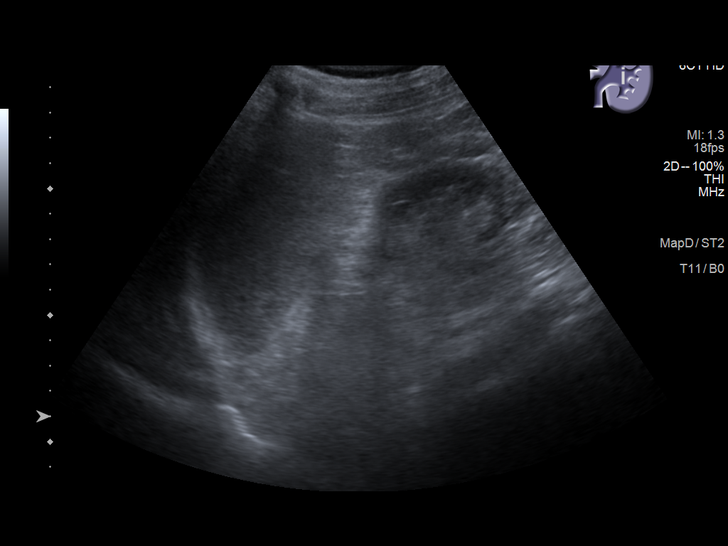
[im 18/34]
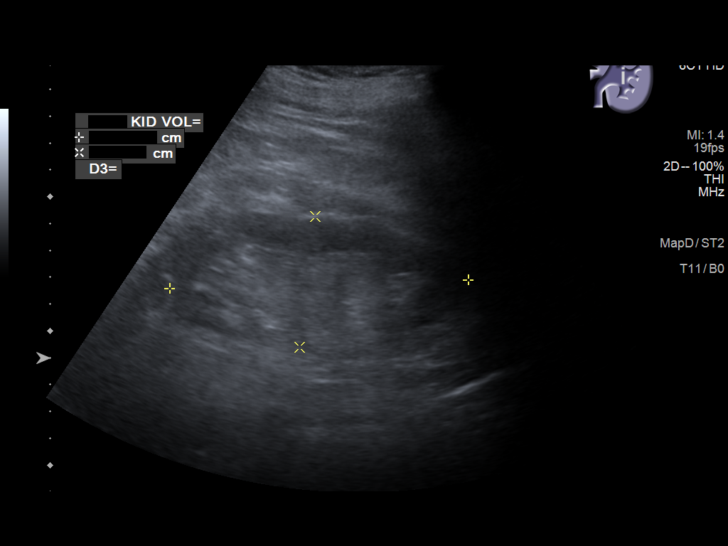
[im 21/34]
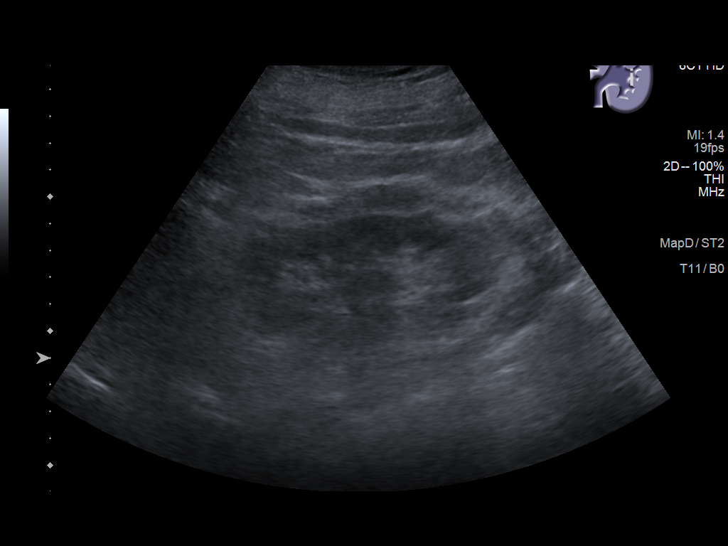
[im 23/34]
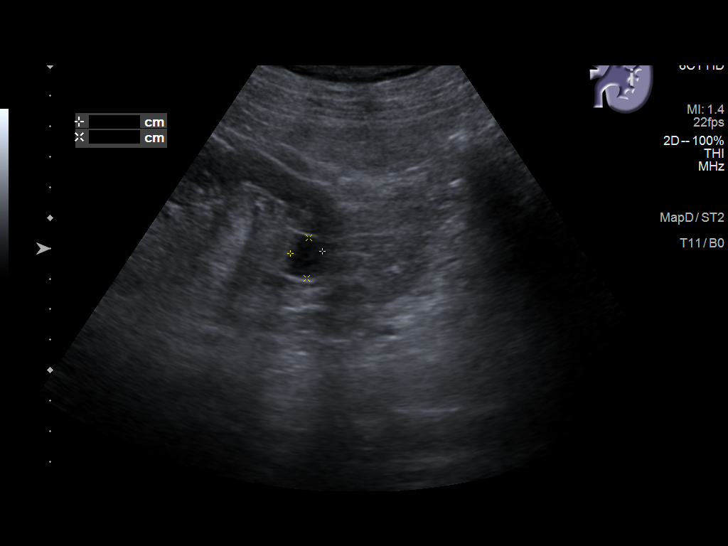
[im 25/34]
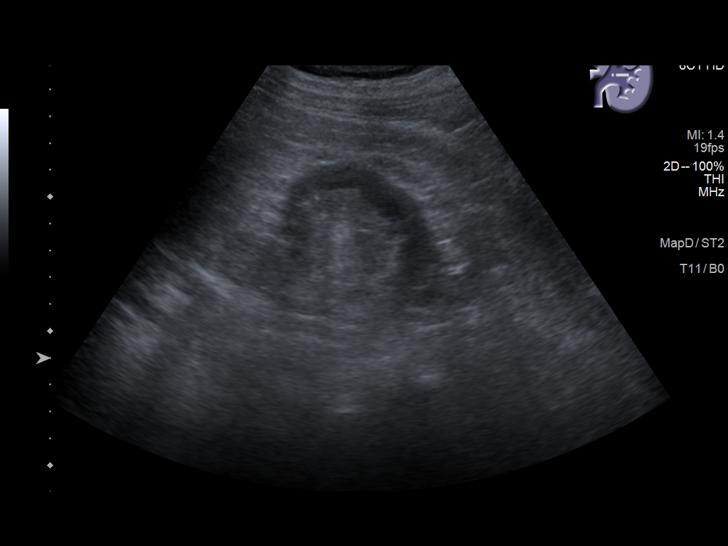
[im 28/34]
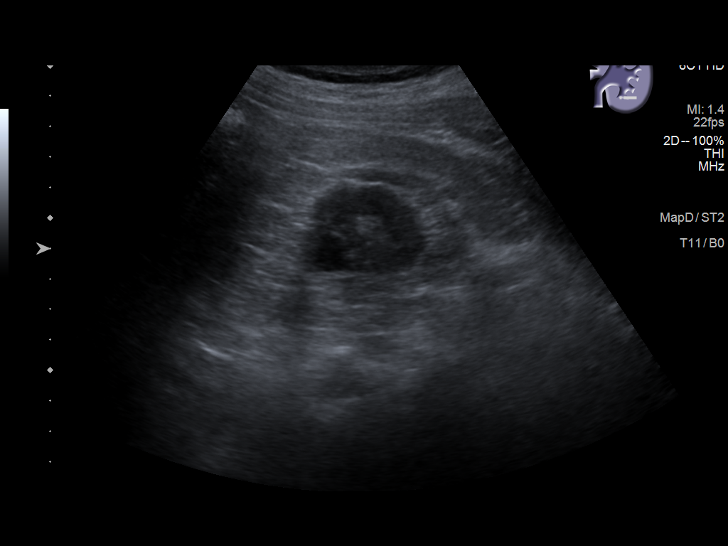
[im 31/34]
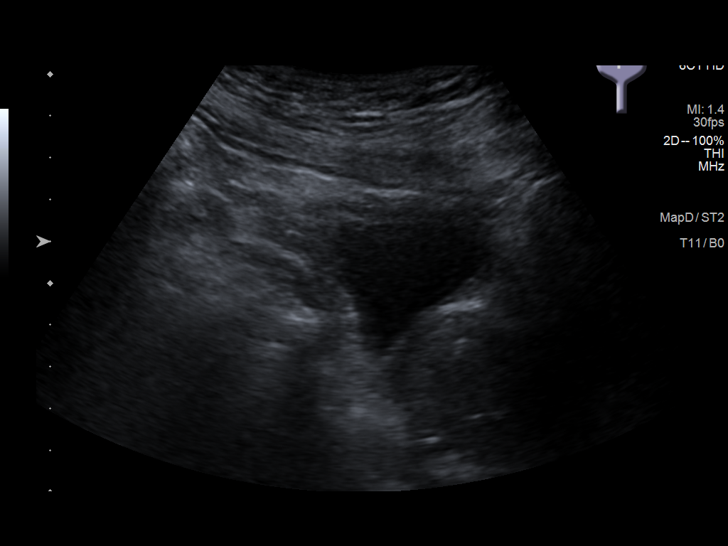
[im 34/34]
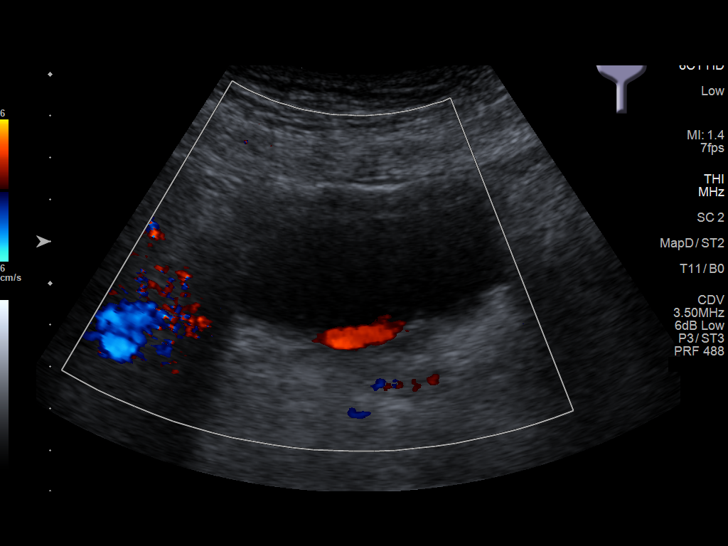

[14 of 25 positions shown; findings below may reference images not displayed]

FINDINGS: Right Kidney:

Renal measurements: 9.6 x 4.8 x 3.9 cm = volume: 94 mL. Echogenicity
within normal limits. Cortical thinning. No mass or hydronephrosis
visualized. 2.0 cm simple cyst in the medial upper pole.

Left Kidney:

Renal measurements: 11.1 x 4.9 x 4.8 cm = volume: 137 mL.
Echogenicity within normal limits. Cortical thinning. No mass or
hydronephrosis visualized. 1.3 cm simple cyst in the medial lower
pole.

Bladder:

Mild bladder wall thickening may be related to underdistention.

Other:

None.
IMPRESSION: 1. No acute abnormality.
2. Bilateral renal cortical thinning.

## 2021-12-17 ENCOUNTER — Encounter: Payer: Self-pay | Admitting: Podiatry

## 2021-12-17 ENCOUNTER — Ambulatory Visit (INDEPENDENT_AMBULATORY_CARE_PROVIDER_SITE_OTHER): Payer: Medicare HMO | Admitting: Podiatry

## 2021-12-17 DIAGNOSIS — M79675 Pain in left toe(s): Secondary | ICD-10-CM | POA: Diagnosis not present

## 2021-12-17 DIAGNOSIS — E119 Type 2 diabetes mellitus without complications: Secondary | ICD-10-CM | POA: Diagnosis not present

## 2021-12-17 DIAGNOSIS — B351 Tinea unguium: Secondary | ICD-10-CM | POA: Diagnosis not present

## 2021-12-17 DIAGNOSIS — M79674 Pain in right toe(s): Secondary | ICD-10-CM | POA: Diagnosis not present

## 2021-12-17 NOTE — Progress Notes (Signed)
This patient returns to my office for at risk foot care.  This patient requires this care by a professional since this patient will be at risk due to having diabetes.  This patient is unable to cut nails himself since the patient cannot reach his nails.These nails are painful walking and wearing shoes.  This patient presents for at risk foot care today.  General Appearance  Alert, conversant and in no acute stress.  Vascular  Dorsalis pedis and posterior tibial  pulses are palpable  bilaterally.  Capillary return is within normal limits  bilaterally. Temperature is within normal limits  bilaterally.  Neurologic  Senn-Weinstein monofilament wire test within normal limits  bilaterally. Muscle power within normal limits bilaterally.  Nails Thick disfigured discolored nails with subungual debris  hallux  bilaterally. No evidence of bacterial infection or drainage bilaterally.  Orthopedic  No limitations of motion  feet .  No crepitus or effusions noted.  No bony pathology or digital deformities noted.  Skin  normotropic skin with no porokeratosis noted bilaterally.  No signs of infections or ulcers noted.     Onychomycosis  Pain in right toes  Pain in left toes  Consent was obtained for treatment procedures.   Mechanical debridement of nails 1-5  bilaterally performed with a nail nipper.  Filed with dremel without incident.    Return office visit    3 months                 Told patient to return for periodic foot care and evaluation due to potential at risk complications.   Brittan Butterbaugh DPM   

## 2022-03-25 ENCOUNTER — Encounter: Payer: Self-pay | Admitting: Podiatry

## 2022-03-25 ENCOUNTER — Ambulatory Visit (INDEPENDENT_AMBULATORY_CARE_PROVIDER_SITE_OTHER): Payer: Medicare HMO | Admitting: Podiatry

## 2022-03-25 DIAGNOSIS — B351 Tinea unguium: Secondary | ICD-10-CM | POA: Diagnosis not present

## 2022-03-25 DIAGNOSIS — E119 Type 2 diabetes mellitus without complications: Secondary | ICD-10-CM | POA: Diagnosis not present

## 2022-03-25 DIAGNOSIS — M79674 Pain in right toe(s): Secondary | ICD-10-CM

## 2022-03-25 DIAGNOSIS — M79675 Pain in left toe(s): Secondary | ICD-10-CM | POA: Diagnosis not present

## 2022-03-25 NOTE — Progress Notes (Signed)
This patient returns to my office for at risk foot care.  This patient requires this care by a professional since this patient will be at risk due to having diabetes.  This patient is unable to cut nails himself since the patient cannot reach his nails.These nails are painful walking and wearing shoes.  This patient presents for at risk foot care today.  General Appearance  Alert, conversant and in no acute stress.  Vascular  Dorsalis pedis and posterior tibial  pulses are palpable  bilaterally.  Capillary return is within normal limits  bilaterally. Temperature is within normal limits  bilaterally.  Neurologic  Senn-Weinstein monofilament wire test within normal limits  bilaterally. Muscle power within normal limits bilaterally.  Nails Thick disfigured discolored nails with subungual debris  hallux  bilaterally. No evidence of bacterial infection or drainage bilaterally.  Orthopedic  No limitations of motion  feet .  No crepitus or effusions noted.  No bony pathology or digital deformities noted.  Skin  normotropic skin with no porokeratosis noted bilaterally.  No signs of infections or ulcers noted.     Onychomycosis  Pain in right toes  Pain in left toes  Consent was obtained for treatment procedures.   Mechanical debridement of nails 1-5  bilaterally performed with a nail nipper.  Filed with dremel without incident.    Return office visit    3 months                 Told patient to return for periodic foot care and evaluation due to potential at risk complications.   Sharilyn Geisinger DPM   

## 2022-07-01 ENCOUNTER — Encounter: Payer: Self-pay | Admitting: Podiatry

## 2022-07-01 ENCOUNTER — Ambulatory Visit (INDEPENDENT_AMBULATORY_CARE_PROVIDER_SITE_OTHER): Payer: Medicare HMO | Admitting: Podiatry

## 2022-07-01 VITALS — BP 163/73 | HR 72

## 2022-07-01 DIAGNOSIS — M79674 Pain in right toe(s): Secondary | ICD-10-CM

## 2022-07-01 DIAGNOSIS — E119 Type 2 diabetes mellitus without complications: Secondary | ICD-10-CM

## 2022-07-01 DIAGNOSIS — M79675 Pain in left toe(s): Secondary | ICD-10-CM

## 2022-07-01 DIAGNOSIS — B351 Tinea unguium: Secondary | ICD-10-CM

## 2022-07-01 NOTE — Progress Notes (Signed)
This patient returns to my office for at risk foot care.  This patient requires this care by a professional since this patient will be at risk due to having diabetes.  This patient is unable to cut nails himself since the patient cannot reach his nails.These nails are painful walking and wearing shoes.  This patient presents for at risk foot care today.  General Appearance  Alert, conversant and in no acute stress.  Vascular  Dorsalis pedis and posterior tibial  pulses are palpable  bilaterally.  Capillary return is within normal limits  bilaterally. Temperature is within normal limits  bilaterally.  Neurologic  Senn-Weinstein monofilament wire test within normal limits  bilaterally. Muscle power within normal limits bilaterally.  Nails Thick disfigured discolored nails with subungual debris  hallux  bilaterally. No evidence of bacterial infection or drainage bilaterally.  Orthopedic  No limitations of motion  feet .  No crepitus or effusions noted.  No bony pathology or digital deformities noted.  Skin  normotropic skin with no porokeratosis noted bilaterally.  No signs of infections or ulcers noted.     Onychomycosis  Pain in right toes  Pain in left toes  Consent was obtained for treatment procedures.   Mechanical debridement of nails 1-5  bilaterally performed with a nail nipper.  Filed with dremel without incident.    Return office visit    3 months                 Told patient to return for periodic foot care and evaluation due to potential at risk complications.   Gardiner Barefoot DPM

## 2022-09-30 ENCOUNTER — Ambulatory Visit: Payer: Medicare HMO | Admitting: Podiatry

## 2022-10-08 ENCOUNTER — Ambulatory Visit (INDEPENDENT_AMBULATORY_CARE_PROVIDER_SITE_OTHER): Payer: Medicare HMO | Admitting: Podiatry

## 2022-10-08 DIAGNOSIS — M79675 Pain in left toe(s): Secondary | ICD-10-CM

## 2022-10-08 DIAGNOSIS — B351 Tinea unguium: Secondary | ICD-10-CM | POA: Diagnosis not present

## 2022-10-08 DIAGNOSIS — M79674 Pain in right toe(s): Secondary | ICD-10-CM | POA: Diagnosis not present

## 2022-10-08 NOTE — Progress Notes (Signed)
   Chief Complaint  Patient presents with   Diabetes    Diabetic foot care, nail trim, A1c- 6.2 [id number]     SUBJECTIVE Patient presents to office today complaining of elongated, thickened nails that cause pain while ambulating in shoes.  Patient is unable to trim their own nails. Patient is here for further evaluation and treatment.  Past Medical History:  Diagnosis Date   Actinic keratosis    Bladder cancer (HCC)    a. DX 1998 w/ recurrences in 2000, 2001, 2002, 2006, 12/2010, 06/2011, 12/2012, & 08/2013;  b. 06/2011 s/p TURBT;  c. Quarterly cystoscopies @ [facility], last 01/2015 ->nl.   Cataracts, bilateral    Colon polyps    COPD (chronic obstructive pulmonary disease) (HCC)    Essential hypertension    GERD (gastroesophageal reflux disease)    Gout    History of tobacco abuse    Hyperlipidemia    Insomnia    Osteoarthritis    Prostate hypertrophy    Retinal detachment    Seborrheic dermatitis    Squamous cell carcinoma    Thrombocytopenia (HCC)    Type II diabetes mellitus (HCC)     Allergies  Allergen Reactions   Cephalosporins Hives   Ciprofloxacin Hives, Shortness Of Breath and Rash   Doxycycline Hives   Streptomycin Hives   Sulfa Antibiotics Hives   Amoxicillin Hives   Atenolol     Other reaction(s): Other (See Comments) Slowed his heart rate.   Penicillins Hives   Hydroxychloroquine Rash    Other reaction(s): Other (See Comments) unknown unknown Other reaction(s): Other (See Comments) unknown     OBJECTIVE General Patient is awake, alert, and oriented x 3 and in no acute distress. Derm Skin is dry and supple bilateral. Negative open lesions or macerations. Remaining integument unremarkable. Nails are tender, long, thickened and dystrophic with subungual debris, consistent with onychomycosis, 1-5 bilateral. No signs of infection noted. Vasc  DP and PT pedal pulses palpable bilaterally. Temperature gradient within normal limits.  Neuro Epicritic and protective  threshold sensation grossly intact bilaterally.  Musculoskeletal Exam No symptomatic pedal deformities noted bilateral. Muscular strength within normal limits.  ASSESSMENT 1.  Pain due to onychomycosis of toenails both  PLAN OF CARE 1. Patient evaluated today.  2. Instructed to maintain good pedal hygiene and foot care.  3. Mechanical debridement of nails 1-5 bilaterally performed using a nail nipper. Filed with dremel without incident.  4. Return to clinic in 3 mos.    Felecia Shelling, DPM [facility]  Dr. Felecia Shelling, DPM    [address] [place], [place] [address]                Office [phone]  Fax [phone]

## 2022-11-01 ENCOUNTER — Other Ambulatory Visit: Payer: Self-pay

## 2022-11-01 ENCOUNTER — Emergency Department: Payer: Medicare HMO

## 2022-11-01 ENCOUNTER — Inpatient Hospital Stay
Admission: EM | Admit: 2022-11-01 | Discharge: 2022-11-06 | DRG: 190 | Disposition: A | Discharge status: Home Health | Payer: Medicare HMO | Attending: Student | Admitting: Student

## 2022-11-01 DIAGNOSIS — N179 Acute kidney failure, unspecified: Secondary | ICD-10-CM | POA: Diagnosis present

## 2022-11-01 DIAGNOSIS — J4489 Other specified chronic obstructive pulmonary disease: Secondary | ICD-10-CM | POA: Diagnosis present

## 2022-11-01 DIAGNOSIS — J45901 Unspecified asthma with (acute) exacerbation: Secondary | ICD-10-CM | POA: Diagnosis present

## 2022-11-01 DIAGNOSIS — Z880 Allergy status to penicillin: Secondary | ICD-10-CM

## 2022-11-01 DIAGNOSIS — D6959 Other secondary thrombocytopenia: Secondary | ICD-10-CM | POA: Diagnosis present

## 2022-11-01 DIAGNOSIS — D509 Iron deficiency anemia, unspecified: Secondary | ICD-10-CM | POA: Diagnosis present

## 2022-11-01 DIAGNOSIS — E559 Vitamin D deficiency, unspecified: Secondary | ICD-10-CM | POA: Diagnosis present

## 2022-11-01 DIAGNOSIS — N400 Enlarged prostate without lower urinary tract symptoms: Secondary | ICD-10-CM | POA: Diagnosis present

## 2022-11-01 DIAGNOSIS — J9601 Acute respiratory failure with hypoxia: Secondary | ICD-10-CM | POA: Diagnosis present

## 2022-11-01 DIAGNOSIS — G8929 Other chronic pain: Secondary | ICD-10-CM | POA: Diagnosis present

## 2022-11-01 DIAGNOSIS — R7989 Other specified abnormal findings of blood chemistry: Secondary | ICD-10-CM

## 2022-11-01 DIAGNOSIS — Z87891 Personal history of nicotine dependence: Secondary | ICD-10-CM

## 2022-11-01 DIAGNOSIS — Z1152 Encounter for screening for COVID-19: Secondary | ICD-10-CM

## 2022-11-01 DIAGNOSIS — K219 Gastro-esophageal reflux disease without esophagitis: Secondary | ICD-10-CM | POA: Diagnosis present

## 2022-11-01 DIAGNOSIS — J189 Pneumonia, unspecified organism: Secondary | ICD-10-CM | POA: Diagnosis present

## 2022-11-01 DIAGNOSIS — I129 Hypertensive chronic kidney disease with stage 1 through stage 4 chronic kidney disease, or unspecified chronic kidney disease: Secondary | ICD-10-CM | POA: Diagnosis present

## 2022-11-01 DIAGNOSIS — E871 Hypo-osmolality and hyponatremia: Secondary | ICD-10-CM | POA: Diagnosis present

## 2022-11-01 DIAGNOSIS — Z841 Family history of disorders of kidney and ureter: Secondary | ICD-10-CM

## 2022-11-01 DIAGNOSIS — E872 Acidosis, unspecified: Secondary | ICD-10-CM | POA: Diagnosis present

## 2022-11-01 DIAGNOSIS — Z881 Allergy status to other antibiotic agents status: Secondary | ICD-10-CM

## 2022-11-01 DIAGNOSIS — J441 Chronic obstructive pulmonary disease with (acute) exacerbation: Principal | ICD-10-CM | POA: Diagnosis present

## 2022-11-01 DIAGNOSIS — Z66 Do not resuscitate: Secondary | ICD-10-CM | POA: Diagnosis present

## 2022-11-01 DIAGNOSIS — Z8551 Personal history of malignant neoplasm of bladder: Secondary | ICD-10-CM

## 2022-11-01 DIAGNOSIS — I10 Essential (primary) hypertension: Secondary | ICD-10-CM | POA: Diagnosis present

## 2022-11-01 DIAGNOSIS — Z7982 Long term (current) use of aspirin: Secondary | ICD-10-CM

## 2022-11-01 DIAGNOSIS — Z882 Allergy status to sulfonamides status: Secondary | ICD-10-CM

## 2022-11-01 DIAGNOSIS — E1122 Type 2 diabetes mellitus with diabetic chronic kidney disease: Secondary | ICD-10-CM | POA: Diagnosis present

## 2022-11-01 DIAGNOSIS — Z833 Family history of diabetes mellitus: Secondary | ICD-10-CM

## 2022-11-01 DIAGNOSIS — Z888 Allergy status to other drugs, medicaments and biological substances status: Secondary | ICD-10-CM

## 2022-11-01 DIAGNOSIS — Z79899 Other long term (current) drug therapy: Secondary | ICD-10-CM

## 2022-11-01 DIAGNOSIS — N1832 Chronic kidney disease, stage 3b: Secondary | ICD-10-CM | POA: Diagnosis present

## 2022-11-01 DIAGNOSIS — R6510 Systemic inflammatory response syndrome (SIRS) of non-infectious origin without acute organ dysfunction: Secondary | ICD-10-CM | POA: Diagnosis present

## 2022-11-01 DIAGNOSIS — F3342 Major depressive disorder, recurrent, in full remission: Secondary | ICD-10-CM | POA: Diagnosis present

## 2022-11-01 DIAGNOSIS — E785 Hyperlipidemia, unspecified: Secondary | ICD-10-CM | POA: Diagnosis present

## 2022-11-01 DIAGNOSIS — E119 Type 2 diabetes mellitus without complications: Secondary | ICD-10-CM

## 2022-11-01 DIAGNOSIS — Z86010 Personal history of colonic polyps: Secondary | ICD-10-CM

## 2022-11-01 DIAGNOSIS — F419 Anxiety disorder, unspecified: Secondary | ICD-10-CM | POA: Diagnosis present

## 2022-11-01 DIAGNOSIS — D696 Thrombocytopenia, unspecified: Secondary | ICD-10-CM

## 2022-11-01 DIAGNOSIS — M109 Gout, unspecified: Secondary | ICD-10-CM | POA: Diagnosis present

## 2022-11-01 DIAGNOSIS — J440 Chronic obstructive pulmonary disease with acute lower respiratory infection: Secondary | ICD-10-CM | POA: Diagnosis present

## 2022-11-01 LAB — COMPREHENSIVE METABOLIC PANEL
ALT: 14 U/L (ref 0–44)
AST: 20 U/L (ref 15–41)
Albumin: 3.7 g/dL (ref 3.5–5.0)
Alkaline Phosphatase: 67 U/L (ref 38–126)
Anion gap: 11 (ref 5–15)
BUN: 60 mg/dL — ABNORMAL HIGH (ref 8–23)
CO2: 19 mmol/L — ABNORMAL LOW (ref 22–32)
Calcium: 8 mg/dL — ABNORMAL LOW (ref 8.9–10.3)
Chloride: 102 mmol/L (ref 98–111)
Creatinine, Ser: 2.31 mg/dL — ABNORMAL HIGH (ref 0.61–1.24)
GFR, Estimated: 26 mL/min — ABNORMAL LOW (ref >60)
Glucose, Bld: 221 mg/dL — ABNORMAL HIGH (ref 70–99)
Potassium: 4.1 mmol/L (ref 3.5–5.1)
Sodium: 132 mmol/L — ABNORMAL LOW (ref 135–145)
Total Bilirubin: 3.2 mg/dL — ABNORMAL HIGH (ref 0.3–1.2)
Total Protein: 7 g/dL (ref 6.5–8.1)

## 2022-11-01 LAB — CBC WITH DIFFERENTIAL/PLATELET
Abs Immature Granulocytes: 0.27 10*3/uL — ABNORMAL HIGH (ref 0.00–0.07)
Basophils Absolute: 0.1 10*3/uL (ref 0.0–0.1)
Basophils Relative: 1 %
Eosinophils Absolute: 0 10*3/uL (ref 0.0–0.5)
Eosinophils Relative: 0 %
HCT: 32.7 % — ABNORMAL LOW (ref 39.0–52.0)
Hemoglobin: 10.8 g/dL — ABNORMAL LOW (ref 13.0–17.0)
Immature Granulocytes: 2 %
Lymphocytes Relative: 9 %
Lymphs Abs: 1.5 10*3/uL (ref 0.7–4.0)
MCH: 31.2 pg (ref 26.0–34.0)
MCHC: 33 g/dL (ref 30.0–36.0)
MCV: 94.5 fL (ref 80.0–100.0)
Monocytes Absolute: 3.2 10*3/uL — ABNORMAL HIGH (ref 0.1–1.0)
Monocytes Relative: 18 %
Neutro Abs: 12.5 10*3/uL — ABNORMAL HIGH (ref 1.7–7.7)
Neutrophils Relative %: 70 %
Platelets: 114 10*3/uL — ABNORMAL LOW (ref 150–400)
RBC: 3.46 MIL/uL — ABNORMAL LOW (ref 4.22–5.81)
RDW: 14.4 % (ref 11.5–15.5)
WBC: 17.6 10*3/uL — ABNORMAL HIGH (ref 4.0–10.5)
nRBC: 0 % (ref 0.0–0.2)

## 2022-11-01 LAB — BLOOD GAS, VENOUS
Acid-base deficit: 3.5 mmol/L — ABNORMAL HIGH (ref 0.0–2.0)
Bicarbonate: 22.1 mmol/L (ref 20.0–28.0)
O2 Content: 3 L/min
O2 Saturation: 62.1 %
Patient temperature: 37
pCO2, Ven: 41 mmHg — ABNORMAL LOW (ref 44–60)
pH, Ven: 7.34 (ref 7.25–7.43)
pO2, Ven: 35 mmHg (ref 32–45)

## 2022-11-01 LAB — TROPONIN I (HIGH SENSITIVITY): Troponin I (High Sensitivity): 30 ng/L — ABNORMAL HIGH (ref <18)

## 2022-11-01 MED ORDER — SODIUM CHLORIDE 0.9 % IV BOLUS
500.0000 mL | Freq: Once | INTRAVENOUS | Status: AC
  Administered 2022-11-02: 500 mL via INTRAVENOUS

## 2022-11-01 MED ORDER — IPRATROPIUM-ALBUTEROL 0.5-2.5 (3) MG/3ML IN SOLN
3.0000 mL | Freq: Once | RESPIRATORY_TRACT | Status: DC
  Filled 2022-11-01: qty 3

## 2022-11-01 NOTE — ED Provider Notes (Signed)
[facility] Provider Note    None    (approximate)   History   Respiratory Distress (Flu like symptoms x 2 days, fatigue)   HPI  Jared Parker is a 89 y.o. male history of COPD ED presents to the ER for evaluation of cough congestion shortness of breath getting worse over the past 2 days now having generalized malaise.  Does not wear home oxygen or CPAP.  Found to be hypoxic.  No recent antibiotics or steroids.  EMS found the patient hypoxic was given DuoNebs as well as Solu-Medrol and route with some improvement but arrives to the ER still quite dyspneic hypoxic requiring supplemental oxygen and she is speaking in short phrases.     Physical Exam   Triage Vital Signs: ED Triage Vitals  Enc Vitals Group     BP 11/01/22 2238 (!) 137/48     Pulse Rate 11/01/22 2238 (!) 120     Resp 11/01/22 2238 (!) 35     Temp 11/01/22 2238 98.6 F (37 C)     Temp Source 11/01/22 2238 Oral     SpO2 11/01/22 2238 91 %     Weight 11/01/22 2242 180 lb (81.6 kg)     Height 11/01/22 2242 5\' 11"  (1.803 m)     Head Circumference --      Peak Flow --      Pain Score --      Pain Loc --      Pain Edu? --      Excl. in GC? --     Most recent vital signs: Vitals:   11/01/22 2238  BP: (!) 137/48  Pulse: (!) 120  Resp: (!) 35  Temp: 98.6 F (37 C)  SpO2: 91%     Constitutional: Alert  ill appearing Eyes: Conjunctivae are normal.  Head: Atraumatic. Nose: No congestion/rhinnorhea. Mouth/Throat: Mucous membranes are moist.   Neck: Painless ROM.  Cardiovascular:   Good peripheral circulation.  Tachycardic Respiratory: Tachypnea, speaking in short phrases, diffuse faint expiratory wheeze. Gastrointestinal: Soft and nontender.  Musculoskeletal:  no deformity no lower extremity edema. Neurologic:  MAE spontaneously. No gross focal neurologic deficits are appreciated.  Skin:  Skin is warm, dry and intact. No rash noted. Psychiatric: Mood and affect are normal.  Speech and behavior are normal.    ED Results / Procedures / Treatments   Labs (all labs ordered are listed, but only abnormal results are displayed) Labs Reviewed  CBC WITH DIFFERENTIAL/PLATELET - Abnormal; Notable for the following components:      Result Value   WBC 17.6 (*)    RBC 3.46 (*)    Hemoglobin 10.8 (*)    HCT 32.7 (*)    Platelets 114 (*)    Neutro Abs 12.5 (*)    Monocytes Absolute 3.2 (*)    Abs Immature Granulocytes 0.27 (*)    All other components within normal limits  COMPREHENSIVE METABOLIC PANEL - Abnormal; Notable for the following components:   Sodium 132 (*)    CO2 19 (*)    Glucose, Bld 221 (*)    BUN 60 (*)    Creatinine, Ser 2.31 (*)    Calcium 8.0 (*)    Total Bilirubin 3.2 (*)    GFR, Estimated 26 (*)    All other components within normal limits  BLOOD GAS, VENOUS - Abnormal; Notable for the following components:   pCO2, Ven 41 (*)    Acid-base deficit 3.5 (*)  All other components within normal limits  TROPONIN I (HIGH SENSITIVITY) - Abnormal; Notable for the following components:   Troponin I (High Sensitivity) 30 (*)    All other components within normal limits  RESP PANEL BY RT-PCR (RSV, FLU A&B, COVID)  [id number]  BRAIN NATRIURETIC PEPTIDE     EKG  ED ECG REPORT I, Willy Eddy, the attending physician, personally viewed and interpreted this ECG.   Date: 11/01/2022  EKG Time: 22:41  Rate: 120  Rhythm: sinus  Axis: left  Intervals: rbbb  ST&T Change: nonspecific st abn    RADIOLOGY Please see ED Course for my review and interpretation.  I personally reviewed all radiographic images ordered to evaluate for the above acute complaints and reviewed radiology reports and findings.  These findings were personally discussed with the patient.  Please see medical record for radiology report.    PROCEDURES:  Critical Care performed: Yes, see critical care procedure note(s)  .Critical Care  Performed by: Willy Eddy,  MD Authorized by: Willy Eddy, MD   Critical care provider statement:    Critical care time (minutes):  35   Critical care was necessary to treat or prevent imminent or life-threatening deterioration of the following conditions:  Respiratory failure   Critical care was time spent personally by me on the following activities:  Ordering and performing treatments and interventions, ordering and review of laboratory studies, ordering and review of radiographic studies, pulse oximetry, re-evaluation of patient's condition, review of old charts, obtaining history from patient or surrogate, examination of patient, evaluation of patient's response to treatment, discussions with primary provider, discussions with consultants and development of treatment plan with patient or surrogate    MEDICATIONS ORDERED IN ED: Medications  ipratropium-albuterol (DUONEB) 0.5-2.5 (3) MG/3ML nebulizer solution 3 mL (has no administration in time range)  sodium chloride 0.9 % bolus 500 mL (has no administration in time range)     IMPRESSION / MDM / ASSESSMENT AND PLAN / ED COURSE  I reviewed the triage vital signs and the nursing notes.                              Differential diagnosis includes, but is not limited to, Asthma, copd, CHF, pna, ptx, malignancy, Pe, anemia  Patient presenting to the ER for evaluation of symptoms as described above.  Based on symptoms, risk factors and considered above differential, this presenting complaint could reflect a potentially life-threatening illness therefore the patient will be placed on continuous pulse oximetry and telemetry for monitoring.  Laboratory evaluation will be sent to evaluate for the above complaints.  Will order chest x-ray.  Will place on BiPAP.  Clinical Course as of 11/01/22 2338  Mon Nov 01, 2022  2328 Reassessed and does appear significantly improved clinically.  Noted to have mild AKI on blood work.  Chest x-ray my review and interpretation  without evidence of consolidation. [PR]  2337 Continues to improve.  I think this is primarily COPD exacerbation.  Will consult hospitalist for admission. [PR]    Clinical Course User Index [PR] Willy Eddy, MD     FINAL CLINICAL IMPRESSION(S) / ED DIAGNOSES   Final diagnoses:  Acute respiratory failure with hypoxia (HCC)  COPD exacerbation (HCC)     Rx / DC Orders   ED Discharge Orders     None        Note:  This document was prepared using Dragon voice recognition software and may  include unintentional dictation errors.    Willy Eddy, MD 11/01/22 [phone]

## 2022-11-01 NOTE — ED Triage Notes (Signed)
Brought in via medic for Flu like symptoms x 2 days, fatigue

## 2022-11-02 DIAGNOSIS — J441 Chronic obstructive pulmonary disease with (acute) exacerbation: Secondary | ICD-10-CM

## 2022-11-02 DIAGNOSIS — D6959 Other secondary thrombocytopenia: Secondary | ICD-10-CM | POA: Diagnosis present

## 2022-11-02 DIAGNOSIS — R6510 Systemic inflammatory response syndrome (SIRS) of non-infectious origin without acute organ dysfunction: Secondary | ICD-10-CM | POA: Diagnosis present

## 2022-11-02 DIAGNOSIS — Z8551 Personal history of malignant neoplasm of bladder: Secondary | ICD-10-CM | POA: Diagnosis not present

## 2022-11-02 DIAGNOSIS — N179 Acute kidney failure, unspecified: Secondary | ICD-10-CM | POA: Diagnosis present

## 2022-11-02 DIAGNOSIS — K219 Gastro-esophageal reflux disease without esophagitis: Secondary | ICD-10-CM | POA: Diagnosis present

## 2022-11-02 DIAGNOSIS — I129 Hypertensive chronic kidney disease with stage 1 through stage 4 chronic kidney disease, or unspecified chronic kidney disease: Secondary | ICD-10-CM | POA: Diagnosis present

## 2022-11-02 DIAGNOSIS — J9601 Acute respiratory failure with hypoxia: Secondary | ICD-10-CM | POA: Diagnosis present

## 2022-11-02 DIAGNOSIS — E871 Hypo-osmolality and hyponatremia: Secondary | ICD-10-CM | POA: Diagnosis present

## 2022-11-02 DIAGNOSIS — E1122 Type 2 diabetes mellitus with diabetic chronic kidney disease: Secondary | ICD-10-CM | POA: Diagnosis present

## 2022-11-02 DIAGNOSIS — D509 Iron deficiency anemia, unspecified: Secondary | ICD-10-CM | POA: Diagnosis present

## 2022-11-02 DIAGNOSIS — J45901 Unspecified asthma with (acute) exacerbation: Secondary | ICD-10-CM | POA: Diagnosis present

## 2022-11-02 DIAGNOSIS — E872 Acidosis, unspecified: Secondary | ICD-10-CM | POA: Diagnosis present

## 2022-11-02 DIAGNOSIS — R7989 Other specified abnormal findings of blood chemistry: Secondary | ICD-10-CM

## 2022-11-02 DIAGNOSIS — F3342 Major depressive disorder, recurrent, in full remission: Secondary | ICD-10-CM | POA: Diagnosis present

## 2022-11-02 DIAGNOSIS — M109 Gout, unspecified: Secondary | ICD-10-CM | POA: Diagnosis present

## 2022-11-02 DIAGNOSIS — Z87891 Personal history of nicotine dependence: Secondary | ICD-10-CM | POA: Diagnosis not present

## 2022-11-02 DIAGNOSIS — J189 Pneumonia, unspecified organism: Secondary | ICD-10-CM | POA: Diagnosis present

## 2022-11-02 DIAGNOSIS — N400 Enlarged prostate without lower urinary tract symptoms: Secondary | ICD-10-CM | POA: Diagnosis present

## 2022-11-02 DIAGNOSIS — G8929 Other chronic pain: Secondary | ICD-10-CM | POA: Diagnosis present

## 2022-11-02 DIAGNOSIS — E785 Hyperlipidemia, unspecified: Secondary | ICD-10-CM | POA: Diagnosis present

## 2022-11-02 DIAGNOSIS — D696 Thrombocytopenia, unspecified: Secondary | ICD-10-CM

## 2022-11-02 DIAGNOSIS — N1832 Chronic kidney disease, stage 3b: Secondary | ICD-10-CM | POA: Diagnosis present

## 2022-11-02 DIAGNOSIS — Z1152 Encounter for screening for COVID-19: Secondary | ICD-10-CM | POA: Diagnosis not present

## 2022-11-02 DIAGNOSIS — J440 Chronic obstructive pulmonary disease with acute lower respiratory infection: Secondary | ICD-10-CM | POA: Diagnosis present

## 2022-11-02 DIAGNOSIS — F419 Anxiety disorder, unspecified: Secondary | ICD-10-CM | POA: Diagnosis present

## 2022-11-02 DIAGNOSIS — Z66 Do not resuscitate: Secondary | ICD-10-CM | POA: Diagnosis present

## 2022-11-02 LAB — CULTURE, BLOOD (ROUTINE X 2)
Culture: NO GROWTH
Special Requests: ADEQUATE

## 2022-11-02 LAB — RESP PANEL BY RT-PCR (RSV, FLU A&B, COVID)  RVPGX2
Influenza A by PCR: NEGATIVE
Influenza B by PCR: NEGATIVE
Resp Syncytial Virus by PCR: NEGATIVE
SARS Coronavirus 2 by RT PCR: NEGATIVE

## 2022-11-02 LAB — BASIC METABOLIC PANEL
Anion gap: 10 (ref 5–15)
BUN: 58 mg/dL — ABNORMAL HIGH (ref 8–23)
CO2: 18 mmol/L — ABNORMAL LOW (ref 22–32)
Calcium: 8 mg/dL — ABNORMAL LOW (ref 8.9–10.3)
Chloride: 104 mmol/L (ref 98–111)
Creatinine, Ser: 2.15 mg/dL — ABNORMAL HIGH (ref 0.61–1.24)
GFR, Estimated: 29 mL/min — ABNORMAL LOW (ref >60)
Glucose, Bld: 252 mg/dL — ABNORMAL HIGH (ref 70–99)
Potassium: 4.6 mmol/L (ref 3.5–5.1)
Sodium: 132 mmol/L — ABNORMAL LOW (ref 135–145)

## 2022-11-02 LAB — CBC
HCT: 30.6 % — ABNORMAL LOW (ref 39.0–52.0)
Hemoglobin: 10 g/dL — ABNORMAL LOW (ref 13.0–17.0)
MCH: 30.6 pg (ref 26.0–34.0)
MCHC: 32.7 g/dL (ref 30.0–36.0)
MCV: 93.6 fL (ref 80.0–100.0)
Platelets: 100 10*3/uL — ABNORMAL LOW (ref 150–400)
RBC: 3.27 MIL/uL — ABNORMAL LOW (ref 4.22–5.81)
RDW: 14.4 % (ref 11.5–15.5)
WBC: 13.4 10*3/uL — ABNORMAL HIGH (ref 4.0–10.5)
nRBC: 0 % (ref 0.0–0.2)

## 2022-11-02 LAB — URINALYSIS, COMPLETE (UACMP) WITH MICROSCOPIC
Bilirubin Urine: NEGATIVE
Glucose, UA: NEGATIVE mg/dL
Ketones, ur: NEGATIVE mg/dL
Leukocytes,Ua: NEGATIVE
Nitrite: NEGATIVE
Protein, ur: 30 mg/dL — AB
Specific Gravity, Urine: 1.011 (ref 1.005–1.030)
Squamous Epithelial / HPF: NONE SEEN /HPF (ref 0–5)
pH: 5 (ref 5.0–8.0)

## 2022-11-02 LAB — RETICULOCYTES
Immature Retic Fract: 12.1 % (ref 2.3–15.9)
RBC.: 3.23 MIL/uL — ABNORMAL LOW (ref 4.22–5.81)
Retic Count, Absolute: 71.4 10*3/uL (ref 19.0–186.0)
Retic Ct Pct: 2.2 % (ref 0.4–3.1)

## 2022-11-02 LAB — PROTIME-INR
INR: 1.6 — ABNORMAL HIGH (ref 0.8–1.2)
Prothrombin Time: 19.2 seconds — ABNORMAL HIGH (ref 11.4–15.2)

## 2022-11-02 LAB — CREATININE, SERUM
Creatinine, Ser: 2.3 mg/dL — ABNORMAL HIGH (ref 0.61–1.24)
GFR, Estimated: 26 mL/min — ABNORMAL LOW (ref >60)

## 2022-11-02 LAB — IRON AND TIBC
Iron: 17 ug/dL — ABNORMAL LOW (ref 45–182)
Saturation Ratios: 9 % — ABNORMAL LOW (ref 17.9–39.5)
TIBC: 193 ug/dL — ABNORMAL LOW (ref 250–450)
UIBC: 176 ug/dL

## 2022-11-02 LAB — TROPONIN I (HIGH SENSITIVITY): Troponin I (High Sensitivity): 33 ng/L — ABNORMAL HIGH (ref <18)

## 2022-11-02 LAB — CREATININE, URINE, RANDOM: Creatinine, Urine: 59 mg/dL

## 2022-11-02 LAB — BRAIN NATRIURETIC PEPTIDE: B Natriuretic Peptide: 256.3 pg/mL — ABNORMAL HIGH (ref 0.0–100.0)

## 2022-11-02 LAB — SODIUM, URINE, RANDOM: Sodium, Ur: 28 mmol/L

## 2022-11-02 LAB — MRSA NEXT GEN BY PCR, NASAL: MRSA by PCR Next Gen: NOT DETECTED

## 2022-11-02 LAB — HEPATIC FUNCTION PANEL
ALT: 13 U/L (ref 0–44)
AST: 15 U/L (ref 15–41)
Albumin: 3.4 g/dL — ABNORMAL LOW (ref 3.5–5.0)
Alkaline Phosphatase: 66 U/L (ref 38–126)
Bilirubin, Direct: 0.7 mg/dL — ABNORMAL HIGH (ref 0.0–0.2)
Indirect Bilirubin: 1.9 mg/dL — ABNORMAL HIGH (ref 0.3–0.9)
Total Bilirubin: 2.6 mg/dL — ABNORMAL HIGH (ref 0.3–1.2)
Total Protein: 6.4 g/dL — ABNORMAL LOW (ref 6.5–8.1)

## 2022-11-02 LAB — VITAMIN B12: Vitamin B-12: 638 pg/mL (ref 180–914)

## 2022-11-02 LAB — APTT: aPTT: 38 seconds — ABNORMAL HIGH (ref 24–36)

## 2022-11-02 LAB — FOLATE: Folate: 12.4 ng/mL (ref >5.9)

## 2022-11-02 LAB — FERRITIN: Ferritin: 424 ng/mL — ABNORMAL HIGH (ref 24–336)

## 2022-11-02 MED ORDER — ENOXAPARIN SODIUM 30 MG/0.3ML IJ SOSY
30.0000 mg | PREFILLED_SYRINGE | INTRAMUSCULAR | Status: DC
  Administered 2022-11-02 – 2022-11-03 (×2): 30 mg via SUBCUTANEOUS
  Filled 2022-11-02 (×2): qty 0.3

## 2022-11-02 MED ORDER — IPRATROPIUM-ALBUTEROL 0.5-2.5 (3) MG/3ML IN SOLN
3.0000 mL | RESPIRATORY_TRACT | Status: DC
  Administered 2022-11-02 (×3): 3 mL via RESPIRATORY_TRACT
  Filled 2022-11-02 (×3): qty 3

## 2022-11-02 MED ORDER — FLUTICASONE FUROATE-VILANTEROL 100-25 MCG/ACT IN AEPB
1.0000 | INHALATION_SPRAY | Freq: Two times a day (BID) | RESPIRATORY_TRACT | Status: DC
  Administered 2022-11-02 – 2022-11-06 (×9): 1 via RESPIRATORY_TRACT
  Filled 2022-11-02: qty 28

## 2022-11-02 MED ORDER — SODIUM CHLORIDE 0.9 % IV SOLN
500.0000 mg | INTRAVENOUS | Status: DC
  Administered 2022-11-02: 500 mg via INTRAVENOUS
  Filled 2022-11-02: qty 5

## 2022-11-02 MED ORDER — VANCOMYCIN HCL 2000 MG/400ML IV SOLN
2000.0000 mg | Freq: Once | INTRAVENOUS | Status: AC
  Administered 2022-11-02: 2000 mg via INTRAVENOUS
  Filled 2022-11-02: qty 400

## 2022-11-02 MED ORDER — ASPIRIN 81 MG PO TBEC
81.0000 mg | DELAYED_RELEASE_TABLET | Freq: Every day | ORAL | Status: DC
  Administered 2022-11-02 – 2022-11-06 (×5): 81 mg via ORAL
  Filled 2022-11-02 (×5): qty 1

## 2022-11-02 MED ORDER — SODIUM CHLORIDE 0.9% FLUSH
3.0000 mL | Freq: Two times a day (BID) | INTRAVENOUS | Status: DC
  Administered 2022-11-02 – 2022-11-06 (×10): 3 mL via INTRAVENOUS

## 2022-11-02 MED ORDER — DOCUSATE SODIUM 100 MG PO CAPS
100.0000 mg | ORAL_CAPSULE | Freq: Two times a day (BID) | ORAL | Status: DC
  Administered 2022-11-02 – 2022-11-06 (×8): 100 mg via ORAL
  Filled 2022-11-02 (×10): qty 1

## 2022-11-02 MED ORDER — OXYCODONE HCL 5 MG PO TABS: 5.0000 mg | ORAL_TABLET | Freq: Four times a day (QID) | ORAL | Status: DC | PRN

## 2022-11-02 MED ORDER — POLYETHYLENE GLYCOL 3350 17 G PO PACK: 17.0000 g | PACK | Freq: Every day | ORAL | Status: DC | PRN

## 2022-11-02 MED ORDER — DORZOLAMIDE HCL-TIMOLOL MAL 2-0.5 % OP SOLN
1.0000 [drp] | Freq: Two times a day (BID) | OPHTHALMIC | Status: DC
  Administered 2022-11-02 – 2022-11-06 (×10): 1 [drp] via OPHTHALMIC
  Filled 2022-11-02: qty 10

## 2022-11-02 MED ORDER — GUAIFENESIN-DM 100-10 MG/5ML PO SYRP
5.0000 mL | ORAL_SOLUTION | ORAL | Status: DC | PRN
  Administered 2022-11-02 – 2022-11-04 (×6): 5 mL via ORAL
  Filled 2022-11-02 (×7): qty 10

## 2022-11-02 MED ORDER — ACETYLCYSTEINE 20 % IN SOLN
4.0000 mL | Freq: Two times a day (BID) | RESPIRATORY_TRACT | Status: DC
  Administered 2022-11-02 – 2022-11-06 (×8): 4 mL via RESPIRATORY_TRACT
  Filled 2022-11-02 (×8): qty 4

## 2022-11-02 MED ORDER — ACETAMINOPHEN 325 MG PO TABS: 650.0000 mg | ORAL_TABLET | Freq: Four times a day (QID) | ORAL | Status: DC | PRN

## 2022-11-02 MED ORDER — DOCUSATE SODIUM 100 MG PO CAPS: 100.0000 mg | ORAL_CAPSULE | Freq: Every day | ORAL | Status: DC | PRN

## 2022-11-02 MED ORDER — MIRTAZAPINE 15 MG PO TABS
30.0000 mg | ORAL_TABLET | Freq: Every day | ORAL | Status: DC
  Administered 2022-11-02 – 2022-11-05 (×5): 30 mg via ORAL
  Filled 2022-11-02 (×5): qty 2

## 2022-11-02 MED ORDER — TERAZOSIN HCL 5 MG PO CAPS
5.0000 mg | ORAL_CAPSULE | Freq: Every day | ORAL | Status: DC
  Administered 2022-11-02 – 2022-11-05 (×5): 5 mg via ORAL
  Filled 2022-11-02 (×6): qty 1

## 2022-11-02 MED ORDER — ALLOPURINOL 100 MG PO TABS
200.0000 mg | ORAL_TABLET | Freq: Every day | ORAL | Status: DC
  Administered 2022-11-02 – 2022-11-03 (×2): 200 mg via ORAL
  Filled 2022-11-02 (×2): qty 2

## 2022-11-02 MED ORDER — METHYLPREDNISOLONE SODIUM SUCC 125 MG IJ SOLR
60.0000 mg | INTRAMUSCULAR | Status: AC
  Administered 2022-11-02: 60 mg via INTRAVENOUS
  Filled 2022-11-02: qty 2

## 2022-11-02 MED ORDER — LACTATED RINGERS IV SOLN: INTRAVENOUS | Status: DC

## 2022-11-02 MED ORDER — ACETAMINOPHEN 650 MG RE SUPP: 650.0000 mg | Freq: Four times a day (QID) | RECTAL | Status: DC | PRN

## 2022-11-02 MED ORDER — SODIUM CHLORIDE 0.9 % IV SOLN
1.0000 g | Freq: Three times a day (TID) | INTRAVENOUS | Status: DC
  Administered 2022-11-02 (×2): 1 g via INTRAVENOUS
  Filled 2022-11-02 (×3): qty 5

## 2022-11-02 MED ORDER — CHLORTHALIDONE 25 MG PO TABS
25.0000 mg | ORAL_TABLET | Freq: Every day | ORAL | Status: DC
  Administered 2022-11-02: 25 mg via ORAL
  Filled 2022-11-02: qty 1

## 2022-11-02 MED ORDER — LEVOFLOXACIN 250 MG PO TABS
250.0000 mg | ORAL_TABLET | Freq: Every day | ORAL | Status: DC
  Administered 2022-11-03 – 2022-11-05 (×3): 250 mg via ORAL
  Filled 2022-11-02 (×3): qty 1

## 2022-11-02 MED ORDER — LEVOFLOXACIN IN D5W 500 MG/100ML IV SOLN
500.0000 mg | Freq: Once | INTRAVENOUS | Status: AC
  Administered 2022-11-02: 500 mg via INTRAVENOUS
  Filled 2022-11-02: qty 100

## 2022-11-02 MED ORDER — DULOXETINE HCL 20 MG PO CPEP
20.0000 mg | ORAL_CAPSULE | Freq: Every day | ORAL | Status: DC
  Administered 2022-11-02 – 2022-11-06 (×5): 20 mg via ORAL
  Filled 2022-11-02 (×5): qty 1

## 2022-11-02 MED ORDER — PANTOPRAZOLE SODIUM 40 MG IV SOLR
40.0000 mg | INTRAVENOUS | Status: DC
  Administered 2022-11-02 – 2022-11-03 (×2): 40 mg via INTRAVENOUS
  Filled 2022-11-02 (×2): qty 10

## 2022-11-02 MED ORDER — IPRATROPIUM-ALBUTEROL 0.5-2.5 (3) MG/3ML IN SOLN
3.0000 mL | Freq: Two times a day (BID) | RESPIRATORY_TRACT | Status: DC
  Administered 2022-11-02 – 2022-11-03 (×2): 3 mL via RESPIRATORY_TRACT
  Filled 2022-11-02 (×2): qty 3

## 2022-11-02 MED ORDER — SIMVASTATIN 20 MG PO TABS
10.0000 mg | ORAL_TABLET | Freq: Every day | ORAL | Status: DC
  Administered 2022-11-02 – 2022-11-06 (×5): 10 mg via ORAL
  Filled 2022-11-02 (×5): qty 1

## 2022-11-02 MED ORDER — ACETYLCYSTEINE 20 % IN SOLN
4.0000 mL | Freq: Three times a day (TID) | RESPIRATORY_TRACT | Status: DC
  Filled 2022-11-02 (×2): qty 4

## 2022-11-02 MED ORDER — VANCOMYCIN VARIABLE DOSE PER UNSTABLE RENAL FUNCTION (PHARMACIST DOSING): Status: DC

## 2022-11-02 NOTE — Assessment & Plan Note (Signed)
Setting of AKI, no anion gap elevation.  Monitor at this time very mild.  If persistent, plan to start sodium bicarbonate tablet with a target of 20 mg of bicarbonate

## 2022-11-02 NOTE — Assessment & Plan Note (Signed)
Due to hypoxia and AKI, trend

## 2022-11-02 NOTE — TOC Initial Note (Signed)
Transition of Care [facility]) - Initial/Assessment Note    Patient Details  Name: Jared Parker MRN: [id number] Date of Birth: 08-Jun-1931  Transition of Care [facility] CM/SW Contact:    Margarito Liner, LCSW Phone Number: 11/02/2022, 12:04 PM  Clinical Narrative:   Readmission prevention screen complete. CSW met with patient. No supports at bedside. CSW introduced role and explained that discharge planning would be discussed. PCP is Marguerita Beards, MD in [place]. Patient drives himself to appointments. [facility]acy [facility] [address]. No issues obtaining medications. Patient lives home alone. No home health or DME use prior to admission. He is currently on acute oxygen. Will follow for this potential need as well. No further concerns. CSW encouraged patient to contact CSW as needed. CSW will continue to follow patient for support and facilitate return home once stable. His son will transport him home at discharge.              Expected Discharge Plan: Home/Self Care Barriers to Discharge: Continued Medical Work up   Patient Goals and CMS Choice            Expected Discharge Plan and Services     Post Acute Care Choice: NA Living arrangements for the past 2 months: Single Family Home                                      Prior Living Arrangements/Services Living arrangements for the past 2 months: Single Family Home Lives with:: Self Patient language and need for interpreter reviewed:: Yes Do you feel safe going back to the place where you live?: Yes      Need for Family Participation in Patient Care: Yes (Comment) Care giver support system in place?: Yes (comment)   Criminal Activity/Legal Involvement Pertinent to Current Situation/Hospitalization: No - Comment as needed  Activities of Daily Living      Permission Sought/Granted                  Emotional Assessment Appearance:: Appears stated age Attitude/Demeanor/Rapport: Engaged, Gracious Affect  (typically observed): Accepting, Appropriate, Calm, Pleasant Orientation: : Oriented to Self, Oriented to Place, Oriented to  Time, Oriented to Situation Alcohol / Substance Use: Not Applicable Psych Involvement: No (comment)  Admission diagnosis:  COPD exacerbation (HCC) [J44.1] Acute respiratory failure with hypoxia (HCC) [J96.01] Patient Active Problem List   Diagnosis Date Noted   AKI (acute kidney injury) (HCC) 11/02/2022   Metabolic acidosis 11/02/2022   COPD with acute exacerbation (HCC) 11/02/2022   Troponin I above reference range 11/02/2022   Thrombocytopenia (HCC) 11/02/2022   COPD exacerbation (HCC) 11/02/2022   Pain due to onychomycosis of toenails of both feet 12/17/2021   Pneumonia due to COVID-19 virus 12/20/2019   Acute respiratory failure due to COVID-19 (HCC) 12/20/2019   COPD with chronic bronchitis 12/20/2019   Type II diabetes mellitus (HCC)    Essential hypertension    Hyperlipidemia    Malignant neoplasm of bladder (HCC) 06/01/2011   PCP:  Marguerita Beards, MD Pharmacy:   [facility] [phone] [address] [address], [place] - [address] [place] [address] Phone: [phone] Fax: [phone]     Social Determinants of Health (SDOH) Social History: SDOH Screenings   Tobacco Use: Medium Risk (07/01/2022)   SDOH Interventions:     Readmission Risk Interventions    11/02/2022   12:03 PM  Readmission  Risk Prevention Plan  Transportation Screening Complete  PCP or Specialist Appt within 3-5 Days Complete  Social Work Consult for Recovery Care Planning/Counseling Complete  Palliative Care Screening Not Applicable  Medication Review Oceanographer) Complete

## 2022-11-02 NOTE — Progress Notes (Signed)
Anticoagulation monitoring(Lovenox):  89 yo male ordered Lovenox 40 mg Q24h    Filed Weights   11/01/22 2242  Weight: 81.6 kg (180 lb)   BMI 25.1   Lab Results  Component Value Date   CREATININE 2.31 (H) 11/01/2022   CREATININE 1.32 (H) 12/25/2019   CREATININE 1.27 (H) 12/24/2019   Estimated Creatinine Clearance: 23.1 mL/min (A) (by C-G formula based on SCr of 2.31 mg/dL (H)). Hemoglobin & Hematocrit     Component Value Date/Time   HGB 10.8 (L) 11/01/2022 2247   HCT 32.7 (L) 11/01/2022 2247     Per Protocol for Patient with estCrcl < 30 ml/min and BMI < 30, will transition to Lovenox 30 mg Q24h.

## 2022-11-02 NOTE — H&P (Addendum)
History and Physical    Patient: Jared Parker HYQ:[id number] DOB: [birth year] DOA: 11/01/2022 DOS: the patient was seen and examined on 11/02/2022 PCP: Marguerita Beards, MD  Patient coming from: Home  Chief Complaint:  Chief Complaint  Patient presents with   Respiratory Distress    Flu like symptoms x 2 days, fatigue   HPI: Jared Parker is a 89 y.o. male with medical history significant of COPD.  However patient does not actually use supplementary oxygen at home and typically is able to walk pretty much indefinite distances on level ground without any shortness of breath chest pain or presyncope.  Patient was in his usual state of health till approximately 3 days ago when he reports a new onset of cough with "nasty "yellow sputum production.  This was associated with insidious onset of sensation of breath that was present especially worse with periods of exertion such as walking on level ground.  Since then the patient shortness of breath has gotten progressively worse and today patient was short of breath even at rest.  There is no associated documented fever, although patient felt warm yesterday.  There is no vomiting no chest pain no palpitation no sick leg swelling or cramping.  Patient was markedly short of breath at rest today, prompting the patient to come to the ER.  ER course is notable for the patient found to have new hypoxemia and respiratory distress that required placement on BiPAP.  Patient now reports that he is feeling much better than before however is bothered a little bit by the BiPAP and wonders when it will be taken off.  Family members are at the bedside Review of Systems: As mentioned in the history of present illness. All other systems reviewed and are negative. Past Medical History:  Diagnosis Date   Actinic keratosis    Bladder cancer (HCC)    a. DX 1998 w/ recurrences in 2000, 2001, 2002, 2006, 12/2010, 06/2011, 12/2012, & 08/2013;  b. 06/2011 s/p TURBT;  c. Quarterly  cystoscopies @ [facility], last 01/2015 ->nl.   Cataracts, bilateral    Colon polyps    COPD (chronic obstructive pulmonary disease) (HCC)    Essential hypertension    GERD (gastroesophageal reflux disease)    Gout    History of tobacco abuse    Hyperlipidemia    Insomnia    Osteoarthritis    Prostate hypertrophy    Retinal detachment    Seborrheic dermatitis    Squamous cell carcinoma    Thrombocytopenia (HCC)    Type II diabetes mellitus (HCC)    Past Surgical History:  Procedure Laterality Date   Bladder cancer     Per pt nine surgeries   HERNIA REPAIR     Social History:  reports that he quit smoking about 32 years ago. His smoking use included cigarettes. He has a 80.00 pack-year smoking history. He has never used smokeless tobacco. He reports that he does not drink alcohol and does not use drugs.  Allergies  Allergen Reactions   Cephalosporins Hives   Ciprofloxacin Hives, Shortness Of Breath and Rash   Doxycycline Hives   Streptomycin Hives   Sulfa Antibiotics Hives   Amoxicillin Hives   Atenolol     Other reaction(s): Other (See Comments) Slowed his heart rate.   Penicillins Hives   Hydroxychloroquine Rash    Other reaction(s): Other (See Comments) unknown unknown Other reaction(s): Other (See Comments) unknown    Family History  Problem Relation Age of Onset  Diabetes Mother        died @ 84   Kidney failure Mother    Other Father        died @ 63 of black lung   Cancer Brother    Diabetes Brother    Other Other        two sisters - A & W.    Prior to Admission medications   Medication Sig Start Date End Date Taking? Authorizing Provider  allopurinol (ZYLOPRIM) 100 MG tablet Take 200 mg by mouth daily.    Yes [provider]  ascorbic acid (VITAMIN C) 500 MG tablet Take 1 tablet (500 mg total) by mouth daily. 12/25/19  Yes Arnetha Courser, MD  aspirin EC 81 MG tablet Take 81 mg by mouth daily.   Yes [provider]  chlorthalidone  (HYGROTON) 25 MG tablet Take 25 mg by mouth daily. 11/05/21  Yes [provider]  docusate sodium (COLACE) 100 MG capsule Take 100 mg by mouth daily as needed for mild constipation.    Yes [provider]  dorzolamide-timolol (COSOPT) 2-0.5 % ophthalmic solution Place 1 drop into both eyes 2 (two) times daily.   Yes [provider]  DULoxetine (CYMBALTA) 20 MG capsule Take 20 mg by mouth daily.   Yes [provider]  Fluticasone Furoate (ARNUITY ELLIPTA) 100 MCG/ACT AEPB Inhale 1 puff into the lungs 2 (two) times daily.   Yes [provider]  furosemide (LASIX) 20 MG tablet Take 20 mg by mouth daily.   Yes [provider]  losartan (COZAAR) 50 MG tablet Take 50 mg by mouth daily.   Yes [provider]  mirtazapine (REMERON) 30 MG tablet Take 30 mg by mouth at bedtime.   Yes [provider]  Multiple Vitamin (MULTIVITAMIN WITH MINERALS) TABS tablet Take 1 tablet by mouth daily. 12/25/19  Yes Arnetha Courser, MD  nystatin-triamcinolone (MYCOLOG II) cream Apply 1 Application topically 2 (two) times daily. 10/15/22 10/15/23 Yes [provider]  Omega-3 Fatty Acids (FISH OIL) 1000 MG CAPS Take 1,000 mg by mouth daily.    Yes [provider]  omeprazole (PRILOSEC) 20 MG capsule Take 20 mg by mouth daily.    Yes [provider]  oxyCODONE (OXY IR/ROXICODONE) 5 MG immediate release tablet Take 5-10 mg by mouth every 6 (six) hours as needed for moderate pain or severe pain.    Yes [provider]  polyethylene glycol (MIRALAX / GLYCOLAX) 17 g packet Take 17 g by mouth daily as needed for mild constipation or moderate constipation. 12/25/19  Yes Arnetha Courser, MD  simvastatin (ZOCOR) 10 MG tablet Take 10 mg by mouth daily.   Yes [provider]  terazosin (HYTRIN) 5 MG capsule Take 5 mg by mouth at bedtime.   Yes [provider]  zinc sulfate 220 (50 Zn) MG capsule Take 1 capsule (220 mg  total) by mouth daily. 12/25/19  Adele Schilder, MD    Physical Exam: Vitals:   11/01/22 2238 11/01/22 2242  BP: (!) 137/48   Pulse: (!) 120   Resp: (!) 35   Temp: 98.6 F (37 C)   TempSrc: Oral   SpO2: 91%   Weight:  81.6 kg  Height:  5\' 11"  (1.803 m)   Reasonably built gentleman 89 year old.  Patient is sitting in stretcher with back supported by backrest.  On bilevel positive airway pressure.  12 over 640% FiO2 breathing at 24/min.  Well over 800 mL of calculated minute  ventilation.  Patient does not appear distressed 100% SpO2 Respiratory exam: Bilateral air entry seems to be is vesicular, may be mildly diminished diffusely.  No expiratory wheezes no crackles are heard Cardiovascular exam S1-S2 normal Abdomen soft nontender Extremities warm without edema no focal motor deficit.  Skin no rash Data Reviewed:  Labs on Admission:  Results for orders placed or performed during the hospital encounter of 11/01/22 (from the past 24 hour(s))  CBC with Differential     Status: Abnormal   Collection Time: 11/01/22 10:47 PM  Result Value Ref Range   WBC 17.6 (H) 4.0 - 10.5 K/uL   RBC 3.46 (L) 4.22 - 5.81 MIL/uL   Hemoglobin 10.8 (L) 13.0 - 17.0 g/dL   HCT 40.9 (L) 81.1 - 91.4 %   MCV 94.5 80.0 - 100.0 fL   MCH 31.2 26.0 - 34.0 pg   MCHC 33.0 30.0 - 36.0 g/dL   RDW 78.2 95.6 - 21.3 %   Platelets 114 (L) 150 - 400 K/uL   nRBC 0.0 0.0 - 0.2 %   Neutrophils Relative % 70 %   Neutro Abs 12.5 (H) 1.7 - 7.7 K/uL   Lymphocytes Relative 9 %   Lymphs Abs 1.5 0.7 - 4.0 K/uL   Monocytes Relative 18 %   Monocytes Absolute 3.2 (H) 0.1 - 1.0 K/uL   Eosinophils Relative 0 %   Eosinophils Absolute 0.0 0.0 - 0.5 K/uL   Basophils Relative 1 %   Basophils Absolute 0.1 0.0 - 0.1 K/uL   WBC Morphology MORPHOLOGY UNREMARKABLE    RBC Morphology MORPHOLOGY UNREMARKABLE    Smear Review MORPHOLOGY UNREMARKABLE    Immature Granulocytes 2 %   Abs Immature Granulocytes 0.27 (H) 0.00 - 0.07 K/uL   Comprehensive metabolic panel     Status: Abnormal   Collection Time: 11/01/22 10:47 PM  Result Value Ref Range   Sodium 132 (L) 135 - 145 mmol/L   Potassium 4.1 3.5 - 5.1 mmol/L   Chloride 102 98 - 111 mmol/L   CO2 19 (L) 22 - 32 mmol/L   Glucose, Bld 221 (H) 70 - 99 mg/dL   BUN 60 (H) 8 - 23 mg/dL   Creatinine, Ser 0.86 (H) 0.61 - 1.24 mg/dL   Calcium 8.0 (L) 8.9 - 10.3 mg/dL   Total Protein 7.0 6.5 - 8.1 g/dL   Albumin 3.7 3.5 - 5.0 g/dL   AST 20 15 - 41 U/L   ALT 14 0 - 44 U/L   Alkaline Phosphatase 67 38 - 126 U/L   Total Bilirubin 3.2 (H) 0.3 - 1.2 mg/dL   GFR, Estimated 26 (L) >60 mL/min   Anion gap 11 5 - 15  Troponin I (High Sensitivity)     Status: Abnormal   Collection Time: 11/01/22 10:47 PM  Result Value Ref Range   Troponin I (High Sensitivity) 30 (H) <18 ng/L  Brain natriuretic peptide     Status: Abnormal   Collection Time: 11/01/22 10:47 PM  Result Value Ref Range   B Natriuretic Peptide 256.3 (H) 0.0 - 100.0 pg/mL  Blood gas, venous     Status: Abnormal   Collection Time: 11/01/22 10:47 PM  Result Value Ref Range   O2 Content 3.0 L/min   Delivery systems NASAL CANNULA    pH, Ven 7.34 7.25 - 7.43   pCO2, Ven 41 (L) 44 - 60 mmHg   pO2, Ven 35 32 - 45 mmHg   Bicarbonate 22.1 20.0 - 28.0 mmol/L   Acid-base  deficit 3.5 (H) 0.0 - 2.0 mmol/L   O2 Saturation 62.1 %   Patient temperature 37.0    Collection site VEIN   Resp panel by RT-PCR (RSV, Flu A&B, Covid) Anterior Nasal Swab     Status: None   Collection Time: 11/01/22 10:47 PM   Specimen: Anterior Nasal Swab  Result Value Ref Range   SARS Coronavirus 2 by RT PCR NEGATIVE NEGATIVE   Influenza A by PCR NEGATIVE NEGATIVE   Influenza B by PCR NEGATIVE NEGATIVE   Resp Syncytial Virus by PCR NEGATIVE NEGATIVE   Basic Metabolic Panel: Recent Labs  Lab 11/01/22 2247  NA 132*  K 4.1  CL 102  CO2 19*  GLUCOSE 221*  BUN 60*  CREATININE 2.31*  CALCIUM 8.0*   Liver Function Tests: Recent Labs  Lab  11/01/22 2247  AST 20  ALT 14  ALKPHOS 67  BILITOT 3.2*  PROT 7.0  ALBUMIN 3.7   No results for input(s): "LIPASE", "AMYLASE" in the last 168 hours. No results for input(s): "AMMONIA" in the last 168 hours. CBC: Recent Labs  Lab 11/01/22 2247  WBC 17.6*  NEUTROABS 12.5*  HGB 10.8*  HCT 32.7*  MCV 94.5  PLT 114*   Cardiac Enzymes: Recent Labs  Lab 11/01/22 2247  TROPONINIHS 30*    BNP (last 3 results) No results for input(s): "PROBNP" in the last 8760 hours. CBG: No results for input(s): "GLUCAP" in the last 168 hours.  Radiological Exams on Admission:  DG Chest Portable 1 View  Result Date: 11/01/2022 CLINICAL DATA:  Shortness of breath EXAM: PORTABLE CHEST 1 VIEW COMPARISON:  12/20/2019 FINDINGS: Cardiac shadow is within normal limits. Aortic calcifications are seen. Lungs are well aerated bilaterally. No focal infiltrate or effusion is seen. No acute bony abnormality is noted. IMPRESSION: No active disease. Electronically Signed   By: Alcide Clever M.D.   On: 11/01/2022 23:17    EKG: Independently reviewed. ST segment depression in II, AVF. rbbb   Assessment and Plan: Thrombocytopenia (HCC) Likely due to infection. A.w mild anemia. Wil order work up.  Troponin I above reference range Due to hypoxia and AKI, trend  COPD with acute exacerbation (HCC) Moderate to severe in severity with acute respiratory failure requiring bilevel positive airway pressure treatment in the ER.  Will give 60 mg of methylprednisolone IV now.  However if continued clinical improvement, no plan for continued intravenous steroids.  Therefore I will continue with inhaled bronchodilators and inhaled steroids.  I will also order Mucomyst therapy as well as check blood cultures and IV vancp+aztreonam and azithromycin.  Patient will be transition to progressive care unit.  Fair response thus far.  Trial of weaning as tolerated  Metabolic acidosis Setting of AKI, no anion gap elevation.   Monitor at this time very mild.  If persistent, plan to start sodium bicarbonate tablet with a target of 20 mg of bicarbonate  AKI (acute kidney injury) (HCC) Likely due to hypoxia, check urinalysis sodium and creatinine.  Intake output and weight.  Patient received 500 cc of saline in the ER, I will give only gentle hydration with 75 cc an hour of fluid.  With plan to stop soon  Ppx - pantop. Lovenox.   Home medication reconciliation: Held losartan and Lasix due to AKI.  Consider restarting based on fluid status and resolution of AKI blood pressure is also below target this evening   Advance Care Planning:   Code Status: DNR DNI - advised to me by patient  with family present. Will respect.  Consults: none at this time.  Family Communication: at the bedside. All questions answered.  Severity of Illness: The appropriate patient status for this patient is INPATIENT. Inpatient status is judged to be reasonable and necessary in order to provide the required intensity of service to ensure the patient's safety. The patient's presenting symptoms, physical exam findings, and initial radiographic and laboratory data in the context of their chronic comorbidities is felt to place them at high risk for further clinical deterioration. Furthermore, it is not anticipated that the patient will be medically stable for discharge from the hospital within 2 midnights of admission.   * I certify that at the point of admission it is my clinical judgment that the patient will require inpatient hospital care spanning beyond 2 midnights from the point of admission due to high intensity of service, high risk for further deterioration and high frequency of surveillance required.*  Author: Nolberto Hanlon, MD 11/02/2022 12:32 AM  For on call review [email or web address].

## 2022-11-02 NOTE — Progress Notes (Signed)
  Brief Progress Note (See full H&P from earlier today)   Subjective: Pt has no complaints, states still short winded but breathing a bit better now    Objective: Relevant new results:  CMP reviewed - Na stable, Cr better, WBC better Physical Exam:  BP (!) 98/50 (BP Location: Right Arm)   Pulse 74   Temp (!) 97.5 F (36.4 C) (Oral)   Resp 16   Ht 5\' 11"  (1.803 m)   Wt 81.6 kg   SpO2 94%   BMI 25.09 kg/m  Constitutional:  General Appearance: alert, NAD Respiratory: Normal respiratory effort Breath sounds +wheeze Cardiovascular: S1/S2 normal, no murmur/rub/gallop auscultated No lower extremity edema Gastrointestinal: Nontender, no masses Musculoskeletal:  No clubbing/cyanosis of digits Neurological: No cranial nerve deficit on limited exam Psychiatric: Normal judgment/insight Normal mood and affect   Assessment/Plan changes or updates compared to H&P: Change abx to Levaquin only

## 2022-11-02 NOTE — Assessment & Plan Note (Addendum)
Moderate to severe in severity with acute respiratory failure requiring bilevel positive airway pressure treatment in the ER.  Will give 60 mg of methylprednisolone IV now.  However if continued clinical improvement, no plan for continued intravenous steroids.  Therefore I will continue with inhaled bronchodilators and inhaled steroids.  I will also order Mucomyst therapy as well as check blood cultures and IV vancp+aztreonam and azithromycin.  Patient will be transition to progressive care unit.  Fair response thus far.  Trial of weaning as tolerated

## 2022-11-02 NOTE — Assessment & Plan Note (Signed)
Likely due to hypoxia, check urinalysis sodium and creatinine.  Intake output and weight.  Patient received 500 cc of saline in the ER, I will give only gentle hydration with 75 cc an hour of fluid.  With plan to stop soon

## 2022-11-02 NOTE — Progress Notes (Addendum)
Pharmacy Antibiotic Note  Jared Parker is a 89 y.o. male admitted on 11/01/2022 with pneumonia.  Pharmacy has been consulted for Vancomycin, aztreonam dosing.  Pt allergic to cephalosporins and PCN (hives).  Pt is AKI so will dose vanc for unstable renal function.   Plan: Aztreonam 1 gm IV Q8H ordered to start on 6/18 @ 0100.   Vancomycin 2 gm IV X 1 loading dose ordered for 6/18 @ ~ 0300. Will dose by levels until renal function is stable.  Will draw random vanc on 6/19 @ 0300.   Goal level :  15 - 20 mcg/mL   Height: 5\' 11"  (180.3 cm) Weight: 81.6 kg (180 lb) IBW/kg (Calculated) : 75.3  Temp (24hrs), Avg:98.6 F (37 C), Min:98.6 F (37 C), Max:98.6 F (37 C)  Recent Labs  Lab 11/01/22 2247  WBC 17.6*  CREATININE 2.31*    Estimated Creatinine Clearance: 23.1 mL/min (A) (by C-G formula based on SCr of 2.31 mg/dL (H)).    Allergies  Allergen Reactions   Cephalosporins Hives   Ciprofloxacin Hives, Shortness Of Breath and Rash   Doxycycline Hives   Streptomycin Hives   Sulfa Antibiotics Hives   Amoxicillin Hives   Atenolol     Other reaction(s): Other (See Comments) Slowed his heart rate.   Penicillins Hives   Hydroxychloroquine Rash    Other reaction(s): Other (See Comments) unknown unknown Other reaction(s): Other (See Comments) unknown    Antimicrobials this admission:   >>    >>   Dose adjustments this admission:   Microbiology results:  BCx:   UCx:    Sputum:    MRSA PCR:   Thank you for allowing pharmacy to be a part of this patient's care.  Cecylia Brazill D 11/02/2022 1:08 AM

## 2022-11-02 NOTE — Progress Notes (Signed)
Pharmacy - Antimicrobial Stewardship  Antibiotic allergy clarification  Patient with several antibiotic allergies listed in chart. Following review of chart noted patient had taken amoxicillin/clavulanate in past (despite PCN and amoxicillin allergies listed) and took levofloxacin (despite ciprofloxacin allergy listed).  Patient told me he recalls taking amox/clav and levofloxacin in past without issues.     Juliette Alcide, PharmD, BCPS, BCIDP Work Cell: [phone] 11/02/2022 12:17 PM

## 2022-11-02 NOTE — Assessment & Plan Note (Signed)
Likely due to infection. A.w mild anemia. Wil order work up.

## 2022-11-03 DIAGNOSIS — M1990 Unspecified osteoarthritis, unspecified site: Secondary | ICD-10-CM | POA: Insufficient documentation

## 2022-11-03 DIAGNOSIS — J441 Chronic obstructive pulmonary disease with (acute) exacerbation: Secondary | ICD-10-CM | POA: Diagnosis not present

## 2022-11-03 LAB — BASIC METABOLIC PANEL
Anion gap: 10 (ref 5–15)
BUN: 83 mg/dL — ABNORMAL HIGH (ref 8–23)
CO2: 17 mmol/L — ABNORMAL LOW (ref 22–32)
Calcium: 7.9 mg/dL — ABNORMAL LOW (ref 8.9–10.3)
Chloride: 108 mmol/L (ref 98–111)
Creatinine, Ser: 2.15 mg/dL — ABNORMAL HIGH (ref 0.61–1.24)
GFR, Estimated: 29 mL/min — ABNORMAL LOW (ref >60)
Glucose, Bld: 165 mg/dL — ABNORMAL HIGH (ref 70–99)
Potassium: 4 mmol/L (ref 3.5–5.1)
Sodium: 135 mmol/L (ref 135–145)

## 2022-11-03 LAB — VANCOMYCIN, RANDOM: Vancomycin Rm: 11 ug/mL

## 2022-11-03 LAB — CULTURE, BLOOD (ROUTINE X 2)

## 2022-11-03 MED ORDER — PANTOPRAZOLE SODIUM 40 MG PO TBEC
40.0000 mg | DELAYED_RELEASE_TABLET | Freq: Every day | ORAL | Status: DC
  Administered 2022-11-03 – 2022-11-06 (×4): 40 mg via ORAL
  Filled 2022-11-03 (×4): qty 1

## 2022-11-03 MED ORDER — OMEGA-3-ACID ETHYL ESTERS 1 G PO CAPS
2.0000 g | ORAL_CAPSULE | Freq: Every day | ORAL | Status: DC
  Administered 2022-11-03 – 2022-11-06 (×4): 2 g via ORAL
  Filled 2022-11-03 (×4): qty 2

## 2022-11-03 MED ORDER — ZINC SULFATE 220 (50 ZN) MG PO CAPS
220.0000 mg | ORAL_CAPSULE | Freq: Every day | ORAL | Status: DC
  Administered 2022-11-03 – 2022-11-06 (×4): 220 mg via ORAL
  Filled 2022-11-03 (×4): qty 1

## 2022-11-03 MED ORDER — LIDOCAINE 5 % EX PTCH
1.0000 | MEDICATED_PATCH | CUTANEOUS | Status: DC
  Administered 2022-11-03 – 2022-11-06 (×4): 1 via TRANSDERMAL
  Filled 2022-11-03 (×4): qty 1

## 2022-11-03 MED ORDER — FERROUS SULFATE 325 (65 FE) MG PO TABS
325.0000 mg | ORAL_TABLET | Freq: Every day | ORAL | Status: DC
  Administered 2022-11-04 – 2022-11-06 (×3): 325 mg via ORAL
  Filled 2022-11-03 (×3): qty 1

## 2022-11-03 MED ORDER — VITAMIN C 500 MG PO TABS
500.0000 mg | ORAL_TABLET | Freq: Every day | ORAL | Status: DC
  Administered 2022-11-03 – 2022-11-06 (×4): 500 mg via ORAL
  Filled 2022-11-03 (×4): qty 1

## 2022-11-03 MED ORDER — FUROSEMIDE 20 MG PO TABS: 20.0000 mg | ORAL_TABLET | Freq: Every day | ORAL | Status: DC

## 2022-11-03 MED ORDER — IPRATROPIUM-ALBUTEROL 0.5-2.5 (3) MG/3ML IN SOLN
3.0000 mL | Freq: Four times a day (QID) | RESPIRATORY_TRACT | Status: DC
  Administered 2022-11-03 (×2): 3 mL via RESPIRATORY_TRACT
  Filled 2022-11-03 (×2): qty 3

## 2022-11-03 MED ORDER — ADULT MULTIVITAMIN W/MINERALS CH
1.0000 | ORAL_TABLET | Freq: Every day | ORAL | Status: DC
  Administered 2022-11-03 – 2022-11-06 (×4): 1 via ORAL
  Filled 2022-11-03 (×4): qty 1

## 2022-11-03 MED ORDER — HEPARIN SODIUM (PORCINE) 5000 UNIT/ML IJ SOLN
5000.0000 [IU] | Freq: Three times a day (TID) | INTRAMUSCULAR | Status: DC
  Administered 2022-11-03 – 2022-11-06 (×9): 5000 [IU] via SUBCUTANEOUS
  Filled 2022-11-03 (×9): qty 1

## 2022-11-03 NOTE — Evaluation (Signed)
Physical Therapy Evaluation Patient Details Name: HAITHAM ELRICK MRN: [id number] DOB: 1931-05-22 Today's Date: 11/03/2022  History of Present Illness  Pt is an 89 y/o M who presented with flu like symptoms, in respiratory distress and fatigued, workup confirmed PNA. PMH of bladder CA, squamous cell carcinoma, COPD, HTN, GERD, HLD, OA, DM.  Clinical Impression  Pt is in chair, A&Ox4. Pt denies any pain. PTA pt was independent with mobility and ADL's, still drives, son lives nearby. Pt was able to perform transfers and ambulation c/ RW and CGA, required cueing for hand placement for transfers. PT performed weaning trial during session, SpO2 87 when standing at RW, pt placed back on 2LO2. Pt able to ambulate ~226ft on 2LO2, SpO2 dropped to 84 briefly, but pt tolerated well. Pt left in chair with breakfast tray, alarm set, and needs within reach. Pt would benefit from skilled therapy services in order to improve independence and functional activity tolerance.      Recommendations for follow up therapy are one component of a multi-disciplinary discharge planning process, led by the attending physician.  Recommendations may be updated based on patient status, additional functional criteria and insurance authorization.  Follow Up Recommendations       Assistance Recommended at Discharge Intermittent Supervision/Assistance  Patient can return home with the following  A little help with walking and/or transfers;A little help with bathing/dressing/bathroom;Assistance with cooking/housework;Assist for transportation;Help with stairs or ramp for entrance    Equipment Recommendations None recommended by PT  Recommendations for Other Services       Functional Status Assessment Patient has had a recent decline in their functional status and demonstrates the ability to make significant improvements in function in a reasonable and predictable amount of time.     Precautions / Restrictions  Precautions Precautions: Fall Restrictions Weight Bearing Restrictions: No      Mobility  Bed Mobility                    Transfers Overall transfer level: Needs assistance Equipment used: Rolling walker (2 wheels) Transfers: Sit to/from Stand Sit to Stand: Min guard                Ambulation/Gait Ambulation/Gait assistance: Min guard Gait Distance (Feet): 200 Feet Assistive device: Rolling walker (2 wheels) Gait Pattern/deviations: Step-through pattern          Stairs            Wheelchair Mobility    Modified Rankin (Stroke Patients Only)       Balance Overall balance assessment: Needs assistance Sitting-balance support: Feet supported Sitting balance-Leahy Scale: Good     Standing balance support: Bilateral upper extremity supported Standing balance-Leahy Scale: Good                               Pertinent Vitals/Pain Pain Assessment Pain Assessment: No/denies pain    Home Living Family/patient expects to be discharged to:: Private residence Living Arrangements: Alone Available Help at Discharge: Available PRN/intermittently;Family Type of Home: Mobile home Home Access: Stairs to enter Entrance Stairs-Rails: Left Entrance Stairs-Number of Steps: 4   Home Layout: One level Home Equipment: Agricultural consultant (2 wheels);Cane - single point;Shower seat      Prior Function Prior Level of Function : Independent/Modified Independent;Driving             Mobility Comments: independent with mobility at baseline, does not use DME ADLs Comments: independent c/ ADL's  Hand Dominance   Dominant Hand: Right    Extremity/Trunk Assessment   Upper Extremity Assessment Upper Extremity Assessment: Generalized weakness    Lower Extremity Assessment Lower Extremity Assessment: Generalized weakness    Cervical / Trunk Assessment Cervical / Trunk Assessment: Normal  Communication   Communication: HOH  Cognition  Arousal/Alertness: Awake/alert Behavior During Therapy: WFL for tasks assessed/performed Overall Cognitive Status: Within Functional Limits for tasks assessed                                          General Comments      Exercises     Assessment/Plan    PT Assessment Patient needs continued PT services  PT Problem List Decreased strength;Decreased range of motion;Decreased knowledge of use of DME;Decreased activity tolerance;Decreased safety awareness;Decreased balance;Decreased mobility;Decreased coordination       PT Treatment Interventions DME instruction;Balance training;Gait training;Neuromuscular re-education;Stair training;Functional mobility training;Therapeutic activities;Patient/family education;Therapeutic exercise    PT Goals (Current goals can be found in the Care Plan section)  Acute Rehab PT Goals Patient Stated Goal: would like to return to home PT Goal Formulation: With patient Time For Goal Achievement: 11/17/22 Potential to Achieve Goals: Good    Frequency Min 2X/week     Co-evaluation               AM-PAC PT "6 Clicks" Mobility  Outcome Measure Help needed turning from your back to your side while in a flat bed without using bedrails?: None Help needed moving from lying on your back to sitting on the side of a flat bed without using bedrails?: None Help needed moving to and from a bed to a chair (including a wheelchair)?: A Little Help needed standing up from a chair using your arms (e.g., wheelchair or bedside chair)?: A Little Help needed to walk in hospital room?: A Little Help needed climbing 3-5 steps with a railing? : A Lot 6 Click Score: 19    End of Session Equipment Utilized During Treatment: Gait belt Activity Tolerance: Patient tolerated treatment well Patient left: in chair;with chair alarm set;with call bell/phone within reach Nurse Communication: Mobility status PT Visit Diagnosis: Muscle weakness (generalized)  (M62.81);Other abnormalities of gait and mobility (R26.89)    Time: 7062-3762 PT Time Calculation (min) (ACUTE ONLY): 27 min   Charges:            Lala Lund, PT, SPT  11:32 AM,11/03/22

## 2022-11-03 NOTE — TOC Progression Note (Signed)
Transition of Care [facility]) - Progression Note    Patient Details  Name: Jared Parker MRN: [id number] Date of Birth: 07-26-1931  Transition of Care [facility] CM/SW Contact  Allena Katz, LCSW Phone Number: 11/03/2022, 1:26 PM  Clinical Narrative:  CSW spoke with patient regarding HH. Pt agreeable and reports no preference.   Referral given to [facility] for PT/OT/RN.  Pt interested in help at home and is agreeable to referral being made to [facility] for helper bees. CSW explained aid services are typically not covered by medicare but are covered under select [employer] plans. CSW explained to pt this service is not affiliated with [facility]. Pt agreed to referral being made to [facility] for aide services.      Expected Discharge Plan: Home/Self Care Barriers to Discharge: Continued Medical Work up  Expected Discharge Plan and Services     Post Acute Care Choice: NA Living arrangements for the past 2 months: Single Family Home                                       Social Determinants of Health (SDOH) Interventions SDOH Screenings   Tobacco Use: Medium Risk (07/01/2022)    Readmission Risk Interventions    11/02/2022   12:03 PM  Readmission Risk Prevention Plan  Transportation Screening Complete  PCP or Specialist Appt within 3-5 Days Complete  Social Work Consult for Recovery Care Planning/Counseling Complete  Palliative Care Screening Not Applicable  Medication Review Oceanographer) Complete

## 2022-11-03 NOTE — Evaluation (Signed)
Occupational Therapy Evaluation Patient Details Name: KALYX SPADE MRN: [id number] DOB: Aug 04, 1931 Today's Date: 11/03/2022   History of Present Illness Pt is an 89 y/o M who presented with flu like symptoms, in respiratory distress and fatigued, workup confirmed PNA. PMH of bladder CA, squamous cell carcinoma, COPD, HTN, GERD, HLD, OA, DM.   Clinical Impression   Patient presenting with decreased Ind in self care,balance, endurance, and functional mobility/transfers.Patient reports living at home independently with use of AD at baseline. Pt drives as well.  Patient currently functioning at min guard- min A with RW for toilet transfer, LB clothing management, and standing at sink for grooming tasks. O2 removed during session with O2 saturation at 88%. O2 placed back onto pt and he remains with NT as therapist exits the room.  Patient will benefit from acute OT to increase overall independence in the areas of ADLs, functional mobility, and safety awareness in order to safely discharge.     Recommendations for follow up therapy are one component of a multi-disciplinary discharge planning process, led by the attending physician.  Recommendations may be updated based on patient status, additional functional criteria and insurance authorization.   Assistance Recommended at Discharge Intermittent Supervision/Assistance  Patient can return home with the following A little help with walking and/or transfers;A little help with bathing/dressing/bathroom;Assist for transportation;Help with stairs or ramp for entrance    Functional Status Assessment  Patient has had a recent decline in their functional status and demonstrates the ability to make significant improvements in function in a reasonable and predictable amount of time.  Equipment Recommendations  None recommended by OT       Precautions / Restrictions Precautions Precautions: Fall Restrictions Weight Bearing Restrictions: No       Mobility Bed Mobility                    Transfers Overall transfer level: Needs assistance Equipment used: Rolling walker (2 wheels) Transfers: Sit to/from Stand Sit to Stand: Min guard                  Balance Overall balance assessment: Needs assistance Sitting-balance support: Feet supported Sitting balance-Leahy Scale: Good     Standing balance support: Bilateral upper extremity supported Standing balance-Leahy Scale: Good                             ADL either performed or assessed with clinical judgement   ADL Overall ADL's : Needs assistance/impaired     Grooming: Wash/dry hands;Standing;Supervision/safety               Lower Body Dressing: Min guard;Sit to/from stand   Toilet Transfer: Min guard;Rolling walker (2 wheels);Regular Toilet   Toileting- Architect and Hygiene: Min guard;Sit to/from stand               Vision Baseline Vision/History: 1 Wears glasses Patient Visual Report: No change from baseline              Pertinent Vitals/Pain Pain Assessment Pain Assessment: No/denies pain     Hand Dominance Right   Extremity/Trunk Assessment Upper Extremity Assessment Upper Extremity Assessment: Generalized weakness   Lower Extremity Assessment Lower Extremity Assessment: Generalized weakness   Cervical / Trunk Assessment Cervical / Trunk Assessment: Normal   Communication Communication Communication: HOH   Cognition Arousal/Alertness: Awake/alert Behavior During Therapy: WFL for tasks assessed/performed Overall Cognitive Status: Within Functional Limits for tasks assessed  Home Living Family/patient expects to be discharged to:: Private residence Living Arrangements: Alone Available Help at Discharge: Available PRN/intermittently;Family Type of Home: Mobile home Home Access: Stairs to enter Entrance Stairs-Number of  Steps: 4 Entrance Stairs-Rails: Left Home Layout: One level     Bathroom Shower/Tub: Chief Strategy Officer: Standard Bathroom Accessibility: Yes   Home Equipment: Agricultural consultant (2 wheels);Cane - single point;Shower seat          Prior Functioning/Environment Prior Level of Function : Independent/Modified Independent;Driving             Mobility Comments: independent with mobility at baseline, does not use DME ADLs Comments: independent c/ ADL's        OT Problem List: Decreased strength;Decreased activity tolerance;Decreased safety awareness;Impaired balance (sitting and/or standing);Decreased knowledge of use of DME or AE      OT Treatment/Interventions: Self-care/ADL training;Therapeutic exercise;Therapeutic activities;Energy conservation;DME and/or AE instruction;Patient/family education;Balance training    OT Goals(Current goals can be found in the care plan section) Acute Rehab OT Goals Patient Stated Goal: to go home OT Goal Formulation: With patient Time For Goal Achievement: 11/17/22 Potential to Achieve Goals: Fair ADL Goals Pt Will Perform Grooming: standing;with modified independence Pt Will Perform Lower Body Dressing: with modified independence;sit to/from stand Pt Will Transfer to Toilet: with modified independence;ambulating Pt Will Perform Toileting - Clothing Manipulation and hygiene: with modified independence;sit to/from stand  OT Frequency: Min 2X/week       AM-PAC OT "6 Clicks" Daily Activity     Outcome Measure Help from another person eating meals?: None Help from another person taking care of personal grooming?: None Help from another person toileting, which includes using toliet, bedpan, or urinal?: A Little Help from another person bathing (including washing, rinsing, drying)?: A Little Help from another person to put on and taking off regular upper body clothing?: None Help from another person to put on and taking off  regular lower body clothing?: A Little 6 Click Score: 21   End of Session Equipment Utilized During Treatment: Rolling walker (2 wheels) Nurse Communication: Mobility status  Activity Tolerance: Patient tolerated treatment well Patient left: with call bell/phone within reach;Other (comment);with nursing/sitter in room (standing at sink with NT)  OT Visit Diagnosis: Muscle weakness (generalized) (M62.81)                Time: 1610-9604 OT Time Calculation (min): 18 min Charges:  OT General Charges $OT Visit: 1 Visit OT Evaluation $OT Eval Low Complexity: 1 Low OT Treatments $Self Care/Home Management : 8-22 mins  Jackquline Denmark, MS, OTR/L , CBIS ascom [phone]  11/03/22, 1:09 PM

## 2022-11-03 NOTE — Hospital Course (Addendum)
Jared Parker is a 89 y.o. male with medical history significant of COPD without need for supplementary oxygen. Came to ED 06/17 w/ increased SOB/cough.  06/17: treated in ED, requiring BiPap 06/18: admitted to hospitalist service. Weaned off BiPap.   06/19: SpO2 87 on RA, pt placed back on 2LO2. Pt able to ambulate ~244ft on 2LO2, SpO2 dropped to 84 briefly, but pt tolerated well.     Consultants:  none  Procedures: none      ASSESSMENT & PLAN:   Principal Problem:   COPD exacerbation (HCC) Active Problems:   Type II diabetes mellitus (HCC)   Essential hypertension   Hyperlipidemia   COPD with chronic bronchitis   AKI (acute kidney injury) (HCC)   Metabolic acidosis   COPD with acute exacerbation (HCC)   Troponin I above reference range   Thrombocytopenia (HCC)   Gastro-esophageal reflux disease without esophagitis   Gout   Recurrent major depressive disorder, in full remission (HCC)   COPD with acute exacerbation (HCC) Acute hypoxic respiratory failure, improved  SIRS d/t COPD on admission, vs sepsis d/t CAP (briefly tachycardic/tachypneic w/ leukocytosis POA) Supplemental O2 as needed IV steroids Bronchodilatorys Treating for pneumonia w. Levaquin (multiple allergies) renally dosed   AKI (acute kidney injury) (HCC) on baseline CKD3a  Likely due to acute illness, reduced po intake Monitor BMP shows improvement   HTN Holding lasix and ACE/ARB d/t AKI  Metabolic acidosis, mild Monitor BMP  Troponin I above reference range Due to hypoxia and AKI, trend - flat  Monitor for chest pain  Thrombocytopenia (HCC) Likely due to infection. A.w mild anemia.  Follow CBC  Iron deficiency anemia  Follow CBC Iron supplement and bowel regimen   Hyponatremia - resolved Monitor BMP  Chronic stable conditions GERD - PPI HLD - statin  BPH - terazosin HLD - statin, omega-3  MDD - Cymbalta, Remeron   DVT prophylaxis: heparin d/t renal function  Pertinent IV  fluids/nutrition: avoiding aggressive IV fluids, encourage po fluid intake Central lines / invasive devices: none  Code Status: DNR ACP documentation reviewed: 11/03/22 has documents w/ [facility]OST on file dates 12/21/2019, DNR   Current Admission Status: inpatient   TOC needs / Dispo plan: will need home health Barriers to discharge / significant pending items: new O2 requirement, may be able to d/c next 1-2 days w/ home health depending on O2 need / may need home O2. Of note, pt has asked to stay until Friday, I have not guaranteed this

## 2022-11-03 NOTE — Progress Notes (Signed)
PROGRESS NOTE    Jared Parker   ZOX:[id number] DOB: [birth year]  DOA: 11/01/2022 Date of Service: 11/03/22 PCP: Marguerita Beards, MD     Brief Narrative / Hospital Course:  Jared Parker is a 89 y.o. male with medical history significant of COPD without need for supplementary oxygen. Came to ED 06/17 w/ increased SOB/cough.  06/17: treated in ED, requiring BiPap 06/18: admitted to hospitalist service. Weaned off BiPap.   06/19: SpO2 87 on RA, pt placed back on 2LO2. Pt able to ambulate ~255ft on 2LO2, SpO2 dropped to 84 briefly, but pt tolerated well.     Consultants:  none  Procedures: none      ASSESSMENT & PLAN:   Principal Problem:   COPD exacerbation (HCC) Active Problems:   Type II diabetes mellitus (HCC)   Essential hypertension   Hyperlipidemia   COPD with chronic bronchitis   AKI (acute kidney injury) (HCC)   Metabolic acidosis   COPD with acute exacerbation (HCC)   Troponin I above reference range   Thrombocytopenia (HCC)   Gastro-esophageal reflux disease without esophagitis   Gout   Recurrent major depressive disorder, in full remission (HCC)   COPD with acute exacerbation (HCC) Acute hypoxic respiratory failure, improved  SIRS d/t COPD on admission, vs sepsis d/t CAP (briefly tachycardic/tachypneic w/ leukocytosis POA) Supplemental O2 as needed IV steroids Bronchodilatorys Treating for pneumonia w. Levaquin (multiple allergies) renally dosed   AKI (acute kidney injury) (HCC) on baseline CKD3a  Likely due to acute illness, reduced po intake Monitor BMP shows improvement   HTN Holding lasix and ACE/ARB d/t AKI  Metabolic acidosis, mild Monitor BMP  Troponin I above reference range Due to hypoxia and AKI, trend - flat  Monitor for chest pain  Thrombocytopenia (HCC) Likely due to infection. A.w mild anemia.  Follow CBC  Iron deficiency anemia  Follow CBC Iron supplement and bowel regimen   Hyponatremia - resolved Monitor  BMP  Chronic stable conditions GERD - PPI HLD - statin  BPH - terazosin HLD - statin, omega-3  MDD - Cymbalta, Remeron   DVT prophylaxis: heparin d/t renal function  Pertinent IV fluids/nutrition: avoiding aggressive IV fluids, encourage po fluid intake Central lines / invasive devices: none  Code Status: DNR ACP documentation reviewed: 11/03/22 has documents w/ MOST on file dates 12/21/2019, DNR   Current Admission Status: inpatient   TOC needs / Dispo plan: will need home health Barriers to discharge / significant pending items: new O2 requirement, may be able to d/c next 1-2 days w/ home health depending on O2 need / may need home O2. Of note, pt has asked to stay until Friday, I have not guaranteed this             Subjective / Brief ROS:  Patient reports still SOB on walking, needing O2  Denies CP (+)SOB on exertion Pain controlled but reports chronic lower back pain   Denies new weakness..  Reports no concerns w/ urination/defecation.   Family Communication: pt declined call to family     Objective Findings:  Vitals:   11/03/22 0424 11/03/22 0740 11/03/22 0756 11/03/22 1134  BP:   (!) 120/45 (!) 120/52  Pulse:  66 66 68  Resp:  18 20 20   Temp:   98 F (36.7 C) 98 F (36.7 C)  TempSrc:      SpO2:  98% 97% 98%  Weight: 84.5 kg     Height:        Intake/Output Summary (  Last 24 hours) at 11/03/2022 1342 Last data filed at 11/03/2022 0109 Gross per 24 hour  Intake 720 ml  Output 1300 ml  Net -580 ml   Filed Weights   11/01/22 2242 11/02/22 0409 11/03/22 0424  Weight: 81.6 kg 81.6 kg 84.5 kg    Examination:  Physical Exam Constitutional:      General: He is not in acute distress. Cardiovascular:     Rate and Rhythm: Normal rate and regular rhythm.  Pulmonary:     Effort: Pulmonary effort is normal. No respiratory distress.     Breath sounds: Wheezing present.  Abdominal:     General: Abdomen is flat.     Palpations: Abdomen is soft.   Musculoskeletal:        General: No swelling.  Neurological:     General: No focal deficit present.     Mental Status: He is alert. Mental status is at baseline.  Psychiatric:        Mood and Affect: Mood normal.        Behavior: Behavior normal.          Scheduled Medications:   acetylcysteine  4 mL Nebulization BID   ascorbic acid  500 mg Oral Daily   aspirin EC  81 mg Oral Daily   docusate sodium  100 mg Oral BID   dorzolamide-timolol  1 drop Both Eyes BID   DULoxetine  20 mg Oral Daily   [START ON 11/04/2022] ferrous sulfate  325 mg Oral Q breakfast   fluticasone furoate-vilanterol  1 puff Inhalation BID   heparin injection (subcutaneous)  5,000 Units Subcutaneous Q8H   ipratropium-albuterol  3 mL Nebulization Q6H   levofloxacin  250 mg Oral QHS   lidocaine  1 patch Transdermal Q24H   mirtazapine  30 mg Oral QHS   multivitamin with minerals  1 tablet Oral Daily   omega-3 acid ethyl esters  2 g Oral Daily   pantoprazole  40 mg Oral Daily   simvastatin  10 mg Oral Daily   sodium chloride flush  3 mL Intravenous Q12H   terazosin  5 mg Oral QHS   zinc sulfate  220 mg Oral Daily    Continuous Infusions:   PRN Medications:  acetaminophen **OR** acetaminophen, docusate sodium, guaiFENesin-dextromethorphan, oxyCODONE  Antimicrobials from admission:  Anti-infectives (From admission, onward)    Start     Dose/Rate Route Frequency Ordered Stop   11/03/22 2200  levofloxacin (LEVAQUIN) tablet 250 mg        250 mg Oral Daily at bedtime 11/02/22 1321 11/07/22 2159   11/02/22 2200  levofloxacin (LEVAQUIN) IVPB 500 mg        500 mg 100 mL/hr over 60 Minutes Intravenous  Once 11/02/22 1321 11/02/22 2228   11/02/22 0100  vancomycin (VANCOREADY) IVPB 2000 mg/400 mL        2,000 mg 200 mL/hr over 120 Minutes Intravenous  Once 11/02/22 0056 11/02/22 0536   11/02/22 0100  aztreonam (AZACTAM) 1 g in sodium chloride 0.9 % 100 mL IVPB  Status:  Discontinued        1 g 200 mL/hr  over 30 Minutes Intravenous Every 8 hours 11/02/22 0058 11/02/22 1321   11/02/22 0056  vancomycin variable dose per unstable renal function (pharmacist dosing)  Status:  Discontinued         Does not apply See admin instructions 11/02/22 0057 11/02/22 1321   11/02/22 0045  azithromycin (ZITHROMAX) 500 mg in sodium chloride 0.9 % 250 mL IVPB  Status:  Discontinued        500 mg 250 mL/hr over 60 Minutes Intravenous Every 24 hours 11/02/22 0030 11/02/22 1321           Data Reviewed:  I have personally reviewed the following...  CBC: Recent Labs  Lab 11/01/22 2247 11/02/22 0456  WBC 17.6* 13.4*  NEUTROABS 12.5*  --   HGB 10.8* 10.0*  HCT 32.7* 30.6*  MCV 94.5 93.6  PLT 114* 100*   Basic Metabolic Panel: Recent Labs  Lab 11/01/22 2247 11/02/22 0123 11/02/22 0456 11/03/22 0320  NA 132*  --  132* 135  K 4.1  --  4.6 4.0  CL 102  --  104 108  CO2 19*  --  18* 17*  GLUCOSE 221*  --  252* 165*  BUN 60*  --  58* 83*  CREATININE 2.31* 2.30* 2.15* 2.15*  CALCIUM 8.0*  --  8.0* 7.9*   GFR: Estimated Creatinine Clearance: 24.8 mL/min (A) (by C-G formula based on SCr of 2.15 mg/dL (H)). Liver Function Tests: Recent Labs  Lab 11/01/22 2247 11/02/22 0456  AST 20 15  ALT 14 13  ALKPHOS 67 66  BILITOT 3.2* 2.6*  PROT 7.0 6.4*  ALBUMIN 3.7 3.4*   No results for input(s): "LIPASE", "AMYLASE" in the last 168 hours. No results for input(s): "AMMONIA" in the last 168 hours. Coagulation Profile: Recent Labs  Lab 11/02/22 0456  INR 1.6*   Cardiac Enzymes: No results for input(s): "CKTOTAL", "CKMB", "CKMBINDEX", "TROPONINI" in the last 168 hours. BNP (last 3 results) No results for input(s): "PROBNP" in the last 8760 hours. HbA1C: No results for input(s): "HGBA1C" in the last 72 hours. CBG: No results for input(s): "GLUCAP" in the last 168 hours. Lipid Profile: No results for input(s): "CHOL", "HDL", "LDLCALC", "TRIG", "CHOLHDL", "LDLDIRECT" in the last 72  hours. Thyroid Function Tests: No results for input(s): "TSH", "T4TOTAL", "FREET4", "T3FREE", "THYROIDAB" in the last 72 hours. Anemia Panel: Recent Labs    11/02/22 0456  VITAMINB12 638  FOLATE 12.4  FERRITIN 424*  TIBC 193*  IRON 17*  RETICCTPCT 2.2   Most Recent Urinalysis On File:     Component Value Date/Time   COLORURINE YELLOW (A) 11/02/2022 1136   APPEARANCEUR HAZY (A) 11/02/2022 1136   LABSPEC 1.011 11/02/2022 1136   PHURINE 5.0 11/02/2022 1136   GLUCOSEU NEGATIVE 11/02/2022 1136   HGBUR SMALL (A) 11/02/2022 1136   BILIRUBINUR NEGATIVE 11/02/2022 1136   KETONESUR NEGATIVE 11/02/2022 1136   PROTEINUR 30 (A) 11/02/2022 1136   NITRITE NEGATIVE 11/02/2022 1136   LEUKOCYTESUR NEGATIVE 11/02/2022 1136   Sepsis Labs: @LABRCNTIP (procalcitonin:4,lacticidven:4) Microbiology: Recent Results (from the past 240 hour(s))  Resp panel by RT-PCR (RSV, Flu A&B, Covid) Anterior Nasal Swab     Status: None   Collection Time: 11/01/22 10:47 PM   Specimen: Anterior Nasal Swab  Result Value Ref Range Status   SARS Coronavirus 2 by RT PCR NEGATIVE NEGATIVE Final    Comment: (NOTE) SARS-CoV-2 target nucleic acids are NOT DETECTED.  The SARS-CoV-2 RNA is generally detectable in upper respiratory specimens during the acute phase of infection. The lowest concentration of SARS-CoV-2 viral copies this assay can detect is 138 copies/mL. A negative result does not preclude SARS-Cov-2 infection and should not be used as the sole basis for treatment or other patient management decisions. A negative result may occur with  improper specimen collection/handling, submission of specimen other than nasopharyngeal swab, presence of viral mutation(s) within the areas targeted by  this assay, and inadequate number of viral copies(<138 copies/mL). A negative result must be combined with clinical observations, patient history, and epidemiological information. The expected result is Negative.  Fact  Sheet for Patients:  BloggerCourse.com  Fact Sheet for Healthcare Providers:  SeriousBroker.it  This test is no t yet approved or cleared by the [place] FDA and  has been authorized for detection and/or diagnosis of SARS-CoV-2 by FDA under an Emergency Use Authorization (EUA). This EUA will remain  in effect (meaning this test can be used) for the duration of the COVID-19 declaration under Section 564(b)(1) of the Act, 21 U.S.C.section 360bbb-3(b)(1), unless the authorization is terminated  or revoked sooner.       Influenza A by PCR NEGATIVE NEGATIVE Final   Influenza B by PCR NEGATIVE NEGATIVE Final    Comment: (NOTE) The Xpert Xpress SARS-CoV-2/FLU/RSV plus assay is intended as an aid in the diagnosis of influenza from Nasopharyngeal swab specimens and should not be used as a sole basis for treatment. Nasal washings and aspirates are unacceptable for Xpert Xpress SARS-CoV-2/FLU/RSV testing.  Fact Sheet for Patients: BloggerCourse.com  Fact Sheet for Healthcare Providers: SeriousBroker.it  This test is not yet approved or cleared by the [place] FDA and has been authorized for detection and/or diagnosis of SARS-CoV-2 by FDA under an Emergency Use Authorization (EUA). This EUA will remain in effect (meaning this test can be used) for the duration of the COVID-19 declaration under Section 564(b)(1) of the Act, 21 U.S.C. section 360bbb-3(b)(1), unless the authorization is terminated or revoked.     Resp Syncytial Virus by PCR NEGATIVE NEGATIVE Final    Comment: (NOTE) Fact Sheet for Patients: BloggerCourse.com  Fact Sheet for Healthcare Providers: SeriousBroker.it  This test is not yet approved or cleared by the [place] FDA and has been authorized for detection and/or diagnosis of SARS-CoV-2 by FDA under an  Emergency Use Authorization (EUA). This EUA will remain in effect (meaning this test can be used) for the duration of the COVID-19 declaration under Section 564(b)(1) of the Act, 21 U.S.C. section 360bbb-3(b)(1), unless the authorization is terminated or revoked.  Performed at [facility], [address] [place], [place] [address]   Culture, blood (Routine X 2) w Reflex to ID Panel     Status: None (Preliminary result)   Collection Time: 11/02/22  1:27 AM   Specimen: BLOOD  Result Value Ref Range Status   Specimen Description BLOOD RIGHT FOREARM  Final   Special Requests   Final    BOTTLES DRAWN AEROBIC AND ANAEROBIC Blood Culture adequate volume   Culture   Final    NO GROWTH < 12 HOURS Performed at [facility], [address], [place], [place] [address]    Report Status PENDING  Incomplete  Culture, blood (Routine X 2) w Reflex to ID Panel     Status: None (Preliminary result)   Collection Time: 11/02/22  1:31 AM   Specimen: BLOOD  Result Value Ref Range Status   Specimen Description BLOOD LEFT ASSIST CONTROL  Final   Special Requests   Final    BOTTLES DRAWN AEROBIC AND ANAEROBIC Blood Culture adequate volume   Culture   Final    NO GROWTH < 12 HOURS Performed at [facility], [address] [place], [place] [address]    Report Status PENDING  Incomplete  MRSA Next Gen by PCR, Nasal     Status: None   Collection Time: 11/02/22  7:50 AM  Specimen: Nasal Mucosa; Nasal Swab  Result Value Ref Range Status   MRSA by PCR Next Gen NOT DETECTED NOT DETECTED Final    Comment: (NOTE) The GeneXpert MRSA Assay (FDA approved for NASAL specimens only), is one component of a comprehensive MRSA colonization surveillance program. It is not intended to diagnose MRSA infection nor to guide or monitor treatment for MRSA infections. Test performance is not FDA approved in patients less than 80 years old. Performed at [facility], [address] [place], [place] [address]       Radiology Studies last 3 days: DG Chest Portable 1 View  Result Date: 11/01/2022 CLINICAL DATA:  Shortness of breath EXAM: PORTABLE CHEST 1 VIEW COMPARISON:  12/20/2019 FINDINGS: Cardiac shadow is within normal limits. Aortic calcifications are seen. Lungs are well aerated bilaterally. No focal infiltrate or effusion is seen. No acute bony abnormality is noted. IMPRESSION: No active disease. Electronically Signed   By: Alcide Clever M.D.   On: 11/01/2022 23:17             LOS: 1 day      Sunnie Nielsen, DO [facility] 11/03/2022, 1:42 PM    Dictation software may have been used to generate the above note. Typos may occur and escape review in typed/dictated notes. Please contact Dr Lyn Hollingshead directly for clarity if needed.  Staff may message me via secure chat in Epic  but this may not receive an immediate response,  please page me for urgent matters!  If 7PM-7AM, please contact night coverage [email or web address]

## 2022-11-04 DIAGNOSIS — J441 Chronic obstructive pulmonary disease with (acute) exacerbation: Secondary | ICD-10-CM | POA: Diagnosis not present

## 2022-11-04 LAB — BASIC METABOLIC PANEL
Anion gap: 9 (ref 5–15)
BUN: 80 mg/dL — ABNORMAL HIGH (ref 8–23)
CO2: 22 mmol/L (ref 22–32)
Calcium: 8.5 mg/dL — ABNORMAL LOW (ref 8.9–10.3)
Chloride: 111 mmol/L (ref 98–111)
Creatinine, Ser: 1.75 mg/dL — ABNORMAL HIGH (ref 0.61–1.24)
GFR, Estimated: 37 mL/min — ABNORMAL LOW (ref >60)
Glucose, Bld: 96 mg/dL (ref 70–99)
Potassium: 4 mmol/L (ref 3.5–5.1)
Sodium: 140 mmol/L (ref 135–145)

## 2022-11-04 LAB — EXPECTORATED SPUTUM ASSESSMENT W GRAM STAIN, RFLX TO RESP C

## 2022-11-04 LAB — CULTURE, BLOOD (ROUTINE X 2): Special Requests: ADEQUATE

## 2022-11-04 LAB — PROTEIN ELECTROPHORESIS, SERUM
A/G Ratio: 1.1 (ref 0.7–1.7)
Albumin ELP: 2.9 g/dL (ref 2.9–4.4)
Alpha-1-Globulin: 0.4 g/dL (ref 0.0–0.4)
Alpha-2-Globulin: 0.8 g/dL (ref 0.4–1.0)
Beta Globulin: 0.8 g/dL (ref 0.7–1.3)
Gamma Globulin: 0.6 g/dL (ref 0.4–1.8)
Globulin, Total: 2.6 g/dL (ref 2.2–3.9)
Total Protein ELP: 5.5 g/dL — ABNORMAL LOW (ref 6.0–8.5)

## 2022-11-04 MED ORDER — IPRATROPIUM-ALBUTEROL 0.5-2.5 (3) MG/3ML IN SOLN
3.0000 mL | Freq: Four times a day (QID) | RESPIRATORY_TRACT | Status: DC
  Administered 2022-11-04 – 2022-11-05 (×4): 3 mL via RESPIRATORY_TRACT
  Filled 2022-11-04 (×3): qty 3

## 2022-11-04 MED ORDER — METHYLPREDNISOLONE SODIUM SUCC 40 MG IJ SOLR
40.0000 mg | Freq: Two times a day (BID) | INTRAMUSCULAR | Status: DC
  Administered 2022-11-04 – 2022-11-06 (×4): 40 mg via INTRAVENOUS
  Filled 2022-11-04 (×4): qty 1

## 2022-11-04 NOTE — Progress Notes (Signed)
Mobility Specialist - Progress Note   11/04/22 1402  Mobility  Activity Ambulated with assistance in room;Ambulated with assistance to bathroom  Level of Assistance Contact guard assist, steadying assist  Assistive Device Front wheel walker  Distance Ambulated (ft) 10 ft  Activity Response Tolerated well  Mobility Referral Yes  $Mobility charge 1 Mobility  Mobility Specialist Start Time (ACUTE ONLY) 0151  Mobility Specialist Stop Time (ACUTE ONLY) 0201  Mobility Specialist Time Calculation (min) (ACUTE ONLY) 10 min   Author responds to call bell. Pt in bathroom requesting to return to bed. Pt STS and ambulates back to bed CGA with no LOB noted. Pt left supine in bed with needs in reach and bed alarm activated.   Terrilyn Saver  Mobility Specialist  11/04/22 2:03 PM

## 2022-11-04 NOTE — Progress Notes (Signed)
Occupational Therapy Treatment Patient Details Name: Jared Parker MRN: [id number] DOB: 05-13-1932 Today's Date: 11/04/2022   History of present illness Pt is an 89 y/o M who presented with flu like symptoms, in respiratory distress and fatigued, workup confirmed PNA. PMH of bladder CA, squamous cell carcinoma, COPD, HTN, GERD, HLD, OA, DM.   OT comments  Upon entering the room, pt in bathroom attempting to have BM. Pt is able to perform hygiene and clothing management without physical assistance. Pt stands and transfers to stand at sink for hand hygiene and to brush teeth. Pt remains on 2Ls via [place] throughout session and he reports not feeling as well today and "more congested". Pt's O2 saturation is 90% while standing and pt returns to bed to rest at end of session. RN remains in room with pt. Call bell and all needed items within reach.    Recommendations for follow up therapy are one component of a multi-disciplinary discharge planning process, led by the attending physician.  Recommendations may be updated based on patient status, additional functional criteria and insurance authorization.    Assistance Recommended at Discharge Intermittent Supervision/Assistance  Patient can return home with the following  A little help with walking and/or transfers;A little help with bathing/dressing/bathroom;Assist for transportation;Help with stairs or ramp for entrance   Equipment Recommendations  None recommended by OT       Precautions / Restrictions Precautions Precautions: Fall Restrictions Weight Bearing Restrictions: No       Mobility Bed Mobility Overal bed mobility: Modified Independent Bed Mobility: Sit to Supine       Sit to supine: Modified independent (Device/Increase time)        Transfers                         Balance Overall balance assessment: Needs assistance Sitting-balance support: Feet supported Sitting balance-Leahy Scale: Good     Standing  balance support: Bilateral upper extremity supported, Reliant on assistive device for balance Standing balance-Leahy Scale: Good                             ADL either performed or assessed with clinical judgement   ADL Overall ADL's : Needs assistance/impaired     Grooming: Wash/dry hands;Standing;Supervision/safety                   Toilet Transfer: Supervision/safety;Rolling walker (2 wheels)   Toileting- Clothing Manipulation and Hygiene: Supervision/safety;Sit to/from stand       Functional mobility during ADLs: Supervision/safety;Rolling walker (2 wheels)      Extremity/Trunk Assessment Upper Extremity Assessment Upper Extremity Assessment: Generalized weakness   Lower Extremity Assessment Lower Extremity Assessment: Generalized weakness        Vision Patient Visual Report: No change from baseline            Cognition Arousal/Alertness: Awake/alert Behavior During Therapy: WFL for tasks assessed/performed Overall Cognitive Status: Within Functional Limits for tasks assessed                                                     Pertinent Vitals/ Pain       Pain Assessment Pain Assessment: No/denies pain  Home Living  Frequency  Min 2X/week        Progress Toward Goals  OT Goals(current goals can now be found in the care plan section)  Progress towards OT goals: Progressing toward goals     Plan Discharge plan remains appropriate;Frequency remains appropriate       AM-PAC OT "6 Clicks" Daily Activity     Outcome Measure   Help from another person eating meals?: None Help from another person taking care of personal grooming?: None Help from another person toileting, which includes using toliet, bedpan, or urinal?: None Help from another person bathing (including washing, rinsing, drying)?: A Little Help from another person to put on and  taking off regular upper body clothing?: None Help from another person to put on and taking off regular lower body clothing?: A Little 6 Click Score: 22    End of Session Equipment Utilized During Treatment: Rolling walker (2 wheels)  OT Visit Diagnosis: Muscle weakness (generalized) (M62.81)   Activity Tolerance Patient tolerated treatment well   Patient Left with call bell/phone within reach;with nursing/sitter in room;with bed alarm set   Nurse Communication Mobility status        Time: 1610-9604 OT Time Calculation (min): 12 min  Charges: OT General Charges $OT Visit: 1 Visit OT Treatments $Self Care/Home Management : 8-22 mins  Jackquline Denmark, MS, OTR/L , CBIS ascom [phone]  11/04/22, 12:37 PM

## 2022-11-04 NOTE — Progress Notes (Addendum)
PROGRESS NOTE  Jared Parker:[id number] DOB: [birth year] DOA: 11/01/2022 PCP: Marguerita Beards, MD  HPI/Recap of past 24 hours:  Jared Parker is a 89 y.o. male with medical history significant of COPD without need for supplementary oxygen. Came to ED 06/17 w/ increased SOB/cough.  06/17: treated in ED, requiring BiPap 06/18: admitted to hospitalist service. Weaned off BiPap.   06/19: SpO2 87 on RA, pt placed back on 2LO2. Pt able to ambulate ~274ft on 2LO2, SpO2 dropped to 84 briefly, but pt tolerated well.   11/04/2022: Reports dyspnea with minimal exertion.  Also endorses history of asthma.  Wheezing on exam.  Added IV Solu-Medrol.  Assessment/Plan: Principal Problem:   COPD exacerbation (HCC) Active Problems:   Type II diabetes mellitus (HCC)   Essential hypertension   Hyperlipidemia   COPD with chronic bronchitis   AKI (acute kidney injury) (HCC)   Metabolic acidosis   COPD with acute exacerbation (HCC)   Troponin I above reference range   Thrombocytopenia (HCC)   Gastro-esophageal reflux disease without esophagitis   Gout   Recurrent major depressive disorder, in full remission (HCC)  COPD with acute exacerbation (HCC) Self-reported history of asthma Acute hypoxic respiratory failure, secondary to exacerbation of above. SIRS d/t COPD on admission, vs sepsis d/t CAP (briefly tachycardic/tachypneic w/ leukocytosis POA) Not on oxygen supplementation at baseline Continue to maintain a saturation above 92% IV Solu-Medrol 40 mg twice daily Continue bronchodilators and continue Levaquin(multiple allergies). Incentive spirometer Early ambulation.   Improving AKI (acute kidney injury) (HCC) on baseline CKD3B Baseline creatinine appears to be 1.7 with GFR 37. Likely due to acute illness, reduced po intake Monitor BMP shows improvement  Creatinine is downtrending   HTN Holding lasix and ACE/ARB d/t AKI Continue to monitor vital signs   Resolved metabolic acidosis,  mild Monitor BMP   Troponin I above reference range Due to hypoxia and AKI, trend - flat  Continue to monitor for chest pain   Thrombocytopenia (HCC) Likely due to infection. A.w mild anemia.  Follow CBC   Iron deficiency anemia  Follow CBC Iron supplement and bowel regimen    Resolved hyponatremia - resolved Monitor BMP   Chronic stable conditions GERD - PPI HLD - statin  BPH - terazosin HLD - statin, omega-3  MDD - Cymbalta, Remeron     DVT prophylaxis: heparin d/t renal function  Pertinent IV fluids/nutrition: avoiding aggressive IV fluids, encourage po fluid intake Central lines / invasive devices: none   Code Status: DNR ACP documentation reviewed: 11/03/22 has documents w/ MOST on file dates 12/21/2019, DNR      Status is: Inpatient     Objective: Vitals:   11/04/22 0722 11/04/22 0723 11/04/22 1120 11/04/22 1407  BP: (!) 130/53  (!) 137/45   Pulse: 72 71 80 88  Resp: 17 18 18 18   Temp: 98.5 F (36.9 C)  98 F (36.7 C)   TempSrc:      SpO2: 96% 95% 96% 96%  Weight:      Height:        Intake/Output Summary (Last 24 hours) at 11/04/2022 1742 Last data filed at 11/04/2022 1029 Gross per 24 hour  Intake 600 ml  Output 250 ml  Net 350 ml   Filed Weights   11/02/22 0409 11/03/22 0424 11/04/22 0500  Weight: 81.6 kg 84.5 kg 85 kg    Exam:  General: 89 y.o. year-old male well developed well nourished in no acute distress.  Alert and oriented x3. Cardiovascular:  Regular rate and rhythm with no rubs or gallops.  No thyromegaly or JVD noted.   Respiratory: Diffuse wheezing, mild rales at bases. Good inspiratory effort. Abdomen: Soft nontender nondistended with normal bowel sounds x4 quadrants. Musculoskeletal: No lower extremity edema. 2/4 pulses in all 4 extremities. Skin: No ulcerative lesions noted or rashes, Psychiatry: Mood is appropriate for condition and setting   Data Reviewed: CBC: Recent Labs  Lab 11/01/22 2247 11/02/22 0456  WBC  17.6* 13.4*  NEUTROABS 12.5*  --   HGB 10.8* 10.0*  HCT 32.7* 30.6*  MCV 94.5 93.6  PLT 114* 100*   Basic Metabolic Panel: Recent Labs  Lab 11/01/22 2247 11/02/22 0123 11/02/22 0456 11/03/22 0320 11/04/22 0605  NA 132*  --  132* 135 140  K 4.1  --  4.6 4.0 4.0  CL 102  --  104 108 111  CO2 19*  --  18* 17* 22  GLUCOSE 221*  --  252* 165* 96  BUN 60*  --  58* 83* 80*  CREATININE 2.31* 2.30* 2.15* 2.15* 1.75*  CALCIUM 8.0*  --  8.0* 7.9* 8.5*   GFR: Estimated Creatinine Clearance: 30.5 mL/min (A) (by C-G formula based on SCr of 1.75 mg/dL (H)). Liver Function Tests: Recent Labs  Lab 11/01/22 2247 11/02/22 0456  AST 20 15  ALT 14 13  ALKPHOS 67 66  BILITOT 3.2* 2.6*  PROT 7.0 6.4*  ALBUMIN 3.7 3.4*   No results for input(s): "LIPASE", "AMYLASE" in the last 168 hours. No results for input(s): "AMMONIA" in the last 168 hours. Coagulation Profile: Recent Labs  Lab 11/02/22 0456  INR 1.6*   Cardiac Enzymes: No results for input(s): "CKTOTAL", "CKMB", "CKMBINDEX", "TROPONINI" in the last 168 hours. BNP (last 3 results) No results for input(s): "PROBNP" in the last 8760 hours. HbA1C: No results for input(s): "HGBA1C" in the last 72 hours. CBG: No results for input(s): "GLUCAP" in the last 168 hours. Lipid Profile: No results for input(s): "CHOL", "HDL", "LDLCALC", "TRIG", "CHOLHDL", "LDLDIRECT" in the last 72 hours. Thyroid Function Tests: No results for input(s): "TSH", "T4TOTAL", "FREET4", "T3FREE", "THYROIDAB" in the last 72 hours. Anemia Panel: Recent Labs    11/02/22 0456  VITAMINB12 638  FOLATE 12.4  FERRITIN 424*  TIBC 193*  IRON 17*  RETICCTPCT 2.2   Urine analysis:    Component Value Date/Time   COLORURINE YELLOW (A) 11/02/2022 1136   APPEARANCEUR HAZY (A) 11/02/2022 1136   LABSPEC 1.011 11/02/2022 1136   PHURINE 5.0 11/02/2022 1136   GLUCOSEU NEGATIVE 11/02/2022 1136   HGBUR SMALL (A) 11/02/2022 1136   BILIRUBINUR NEGATIVE 11/02/2022 1136    KETONESUR NEGATIVE 11/02/2022 1136   PROTEINUR 30 (A) 11/02/2022 1136   NITRITE NEGATIVE 11/02/2022 1136   LEUKOCYTESUR NEGATIVE 11/02/2022 1136   Sepsis Labs: @LABRCNTIP (procalcitonin:4,lacticidven:4)  ) Recent Results (from the past 240 hour(s))  Resp panel by RT-PCR (RSV, Flu A&B, Covid) Anterior Nasal Swab     Status: None   Collection Time: 11/01/22 10:47 PM   Specimen: Anterior Nasal Swab  Result Value Ref Range Status   SARS Coronavirus 2 by RT PCR NEGATIVE NEGATIVE Final    Comment: (NOTE) SARS-CoV-2 target nucleic acids are NOT DETECTED.  The SARS-CoV-2 RNA is generally detectable in upper respiratory specimens during the acute phase of infection. The lowest concentration of SARS-CoV-2 viral copies this assay can detect is 138 copies/mL. A negative result does not preclude SARS-Cov-2 infection and should not be used as the sole basis for treatment or  other patient management decisions. A negative result may occur with  improper specimen collection/handling, submission of specimen other than nasopharyngeal swab, presence of viral mutation(s) within the areas targeted by this assay, and inadequate number of viral copies(<138 copies/mL). A negative result must be combined with clinical observations, patient history, and epidemiological information. The expected result is Negative.  Fact Sheet for Patients:  BloggerCourse.com  Fact Sheet for Healthcare Providers:  SeriousBroker.it  This test is no t yet approved or cleared by the [place] FDA and  has been authorized for detection and/or diagnosis of SARS-CoV-2 by FDA under an Emergency Use Authorization (EUA). This EUA will remain  in effect (meaning this test can be used) for the duration of the COVID-19 declaration under Section 564(b)(1) of the Act, 21 U.S.C.section 360bbb-3(b)(1), unless the authorization is terminated  or revoked sooner.       Influenza  A by PCR NEGATIVE NEGATIVE Final   Influenza B by PCR NEGATIVE NEGATIVE Final    Comment: (NOTE) The Xpert Xpress SARS-CoV-2/FLU/RSV plus assay is intended as an aid in the diagnosis of influenza from Nasopharyngeal swab specimens and should not be used as a sole basis for treatment. Nasal washings and aspirates are unacceptable for Xpert Xpress SARS-CoV-2/FLU/RSV testing.  Fact Sheet for Patients: BloggerCourse.com  Fact Sheet for Healthcare Providers: SeriousBroker.it  This test is not yet approved or cleared by the [place] FDA and has been authorized for detection and/or diagnosis of SARS-CoV-2 by FDA under an Emergency Use Authorization (EUA). This EUA will remain in effect (meaning this test can be used) for the duration of the COVID-19 declaration under Section 564(b)(1) of the Act, 21 U.S.C. section 360bbb-3(b)(1), unless the authorization is terminated or revoked.     Resp Syncytial Virus by PCR NEGATIVE NEGATIVE Final    Comment: (NOTE) Fact Sheet for Patients: BloggerCourse.com  Fact Sheet for Healthcare Providers: SeriousBroker.it  This test is not yet approved or cleared by the [place] FDA and has been authorized for detection and/or diagnosis of SARS-CoV-2 by FDA under an Emergency Use Authorization (EUA). This EUA will remain in effect (meaning this test can be used) for the duration of the COVID-19 declaration under Section 564(b)(1) of the Act, 21 U.S.C. section 360bbb-3(b)(1), unless the authorization is terminated or revoked.  Performed at [facility], [address] [place], [place] [address]   Culture, blood (Routine X 2) w Reflex to ID Panel     Status: None (Preliminary result)   Collection Time: 11/02/22  1:27 AM   Specimen: BLOOD  Result Value Ref Range Status   Specimen Description BLOOD RIGHT FOREARM  Final   Special  Requests   Final    BOTTLES DRAWN AEROBIC AND ANAEROBIC Blood Culture adequate volume   Culture   Final    NO GROWTH 2 DAYS Performed at [facility], [address] [place], [place] [address]    Report Status PENDING  Incomplete  Culture, blood (Routine X 2) w Reflex to ID Panel     Status: None (Preliminary result)   Collection Time: 11/02/22  1:31 AM   Specimen: BLOOD  Result Value Ref Range Status   Specimen Description BLOOD LEFT ASSIST CONTROL  Final   Special Requests   Final    BOTTLES DRAWN AEROBIC AND ANAEROBIC Blood Culture adequate volume   Culture   Final    NO GROWTH 2 DAYS Performed at [facility], [address] [place], [place] [address]  Report Status PENDING  Incomplete  MRSA Next Gen by PCR, Nasal     Status: None   Collection Time: 11/02/22  7:50 AM   Specimen: Nasal Mucosa; Nasal Swab  Result Value Ref Range Status   MRSA by PCR Next Gen NOT DETECTED NOT DETECTED Final    Comment: (NOTE) The GeneXpert MRSA Assay (FDA approved for NASAL specimens only), is one component of a comprehensive MRSA colonization surveillance program. It is not intended to diagnose MRSA infection nor to guide or monitor treatment for MRSA infections. Test performance is not FDA approved in patients less than 21 years old. Performed at [facility], [address] [place], [place] [address]       Studies: No results found.  Scheduled Meds:  acetylcysteine  4 mL Nebulization BID   ascorbic acid  500 mg Oral Daily   aspirin EC  81 mg Oral Daily   docusate sodium  100 mg Oral BID   dorzolamide-timolol  1 drop Both Eyes BID   DULoxetine  20 mg Oral Daily   ferrous sulfate  325 mg Oral Q breakfast   fluticasone furoate-vilanterol  1 puff Inhalation BID   heparin injection (subcutaneous)  5,000 Units Subcutaneous Q8H   ipratropium-albuterol  3 mL Nebulization Q6H WA   levofloxacin  250 mg Oral QHS   lidocaine  1 patch Transdermal Q24H    mirtazapine  30 mg Oral QHS   multivitamin with minerals  1 tablet Oral Daily   omega-3 acid ethyl esters  2 g Oral Daily   pantoprazole  40 mg Oral Daily   simvastatin  10 mg Oral Daily   sodium chloride flush  3 mL Intravenous Q12H   terazosin  5 mg Oral QHS   zinc sulfate  220 mg Oral Daily    Continuous Infusions:   LOS: 2 days     Darlin Drop, MD [facility] Pager [phone]  If 7PM-7AM, please contact night-coverage [email or web address] Password [facility] 11/04/2022, 5:42 PM

## 2022-11-05 DIAGNOSIS — J441 Chronic obstructive pulmonary disease with (acute) exacerbation: Secondary | ICD-10-CM | POA: Diagnosis not present

## 2022-11-05 LAB — BASIC METABOLIC PANEL
Anion gap: 8 (ref 5–15)
BUN: 64 mg/dL — ABNORMAL HIGH (ref 8–23)
CO2: 23 mmol/L (ref 22–32)
Calcium: 8.4 mg/dL — ABNORMAL LOW (ref 8.9–10.3)
Chloride: 107 mmol/L (ref 98–111)
Creatinine, Ser: 1.5 mg/dL — ABNORMAL HIGH (ref 0.61–1.24)
GFR, Estimated: 44 mL/min — ABNORMAL LOW (ref >60)
Glucose, Bld: 192 mg/dL — ABNORMAL HIGH (ref 70–99)
Potassium: 4.7 mmol/L (ref 3.5–5.1)
Sodium: 138 mmol/L (ref 135–145)

## 2022-11-05 LAB — PROCALCITONIN: Procalcitonin: 0.66 ng/mL

## 2022-11-05 LAB — CBC
HCT: 35 % — ABNORMAL LOW (ref 39.0–52.0)
Hemoglobin: 11.5 g/dL — ABNORMAL LOW (ref 13.0–17.0)
MCH: 30.9 pg (ref 26.0–34.0)
MCHC: 32.9 g/dL (ref 30.0–36.0)
MCV: 94.1 fL (ref 80.0–100.0)
Platelets: 142 10*3/uL — ABNORMAL LOW (ref 150–400)
RBC: 3.72 MIL/uL — ABNORMAL LOW (ref 4.22–5.81)
RDW: 14.6 % (ref 11.5–15.5)
WBC: 13.6 10*3/uL — ABNORMAL HIGH (ref 4.0–10.5)
nRBC: 0 % (ref 0.0–0.2)

## 2022-11-05 LAB — PHOSPHORUS: Phosphorus: 3.5 mg/dL (ref 2.5–4.6)

## 2022-11-05 LAB — MAGNESIUM: Magnesium: 2.3 mg/dL (ref 1.7–2.4)

## 2022-11-05 LAB — CULTURE, BLOOD (ROUTINE X 2)

## 2022-11-05 MED ORDER — IPRATROPIUM-ALBUTEROL 0.5-2.5 (3) MG/3ML IN SOLN
3.0000 mL | RESPIRATORY_TRACT | Status: DC
  Administered 2022-11-05 – 2022-11-06 (×5): 3 mL via RESPIRATORY_TRACT
  Filled 2022-11-05 (×4): qty 3

## 2022-11-05 NOTE — Progress Notes (Signed)
PROGRESS NOTE  Jared Parker ZDG:[id number] DOB: 1932/03/19 DOA: 11/01/2022 PCP: Marguerita Beards, MD  HPI/Recap of past 24 hours: Jared Parker is a 89 y.o. male with medical history significant of COPD without need for supplementary oxygen, self reported asthma, who presented to the ED w/ increased SOB/cough.  Workup revealed COPD exacerbation, acute hypoxic respiratory failure.  IV Solu-Medrol was added on 11/04/2022 due to diffuse wheezing.  Currently on Levaquin for COPD exacerbation, multiple antibiotic allergies.  11/05/2022: The patient was seen and examined at his bedside.  States his breathing is improved after starting IV Solu-Medrol.  Will do home O2 evaluation.   Assessment/Plan: Principal Problem:   COPD exacerbation (HCC) Active Problems:   Type II diabetes mellitus (HCC)   Essential hypertension   Hyperlipidemia   COPD with chronic bronchitis   AKI (acute kidney injury) (HCC)   Metabolic acidosis   COPD with acute exacerbation (HCC)   Troponin I above reference range   Thrombocytopenia (HCC)   Gastro-esophageal reflux disease without esophagitis   Gout   Recurrent major depressive disorder, in full remission (HCC)  COPD with acute exacerbation (HCC) Self-reported history of asthma, with likely exacerbation. Acute hypoxic respiratory failure, secondary to exacerbation of above. SIRS d/t COPD on admission, vs sepsis d/t CAP (briefly tachycardic/tachypneic w/ leukocytosis POA) Not on oxygen supplementation at baseline Continue to wean off oxygen supplementation. Continue IV Solu-Medrol 40 mg twice daily, started on 11/04/2022. Continue bronchodilators and p.o. Levaquin. Continue incentive spirometer Continue to ambulate as tolerated.   Improving AKI (acute kidney injury) (HCC) on baseline CKD3B Baseline creatinine appears to be 1.7 with GFR 37. Likely due to acute illness, reduced po intake Repeat BMP is pending.   HTN Holding lasix and ACE/ARB d/t  AKI Continue to monitor vital signs   Resolved metabolic acidosis, mild Monitor BMP   Troponin I above reference range Due to hypoxia and AKI, trend - flat  Continue to monitor for chest pain   Thrombocytopenia (HCC) Likely due to infection. A.w mild anemia.  Repeat CBC is pending.   Iron deficiency anemia  Iron studies done on 11/02/2022, showing iron deficiency. Continue iron supplement and bowel regimen.   Resolved hyponatremia - resolved Monitor BMP  Chronic anxiety/depression Resume home Cymbalta, Remeron.   Chronic stable conditions GERD - PPI HLD - statin  BPH - terazosin HLD - statin, omega-3      DVT prophylaxis: heparin d/t renal function   Central lines / invasive devices: none   Code Status: DNR  Disposition: Anticipate discharge to home on 11/06/2022 with steroid taper.  Will benefit from IV Solu-Medrol and frequent breathing treatments today      Status is: Inpatient     Objective: Vitals:   11/05/22 0445 11/05/22 0741 11/05/22 0742 11/05/22 1138  BP: (!) 159/66  (!) 135/52 (!) 125/40  Pulse: 63 63 65 (!) 59  Resp: 17 16 16 16   Temp: 97.8 F (36.6 C)  98.1 F (36.7 C) 98.5 F (36.9 C)  TempSrc: Oral     SpO2: 95% 96% 95% 94%  Weight:      Height:        Intake/Output Summary (Last 24 hours) at 11/05/2022 1206 Last data filed at 11/05/2022 0211 Gross per 24 hour  Intake --  Output 600 ml  Net -600 ml   Filed Weights   11/03/22 0424 11/04/22 0500 11/05/22 0415  Weight: 84.5 kg 85 kg 85.4 kg    Exam:  General: 89 y.o. year-old  male well developed well nourished in no acute distress.  Alert and oriented x3. Cardiovascular: Regular rate and rhythm with no rubs or gallops.  No thyromegaly or JVD noted.   Respiratory: Diffuse wheezing bilaterally.  Good inspiratory effort. Abdomen: Soft nontender nondistended with normal bowel sounds x4 quadrants. Musculoskeletal: No lower extremity edema. 2/4 pulses in all 4 extremities. Skin: No  ulcerative lesions noted or rashes, Psychiatry: Mood is appropriate for condition and setting   Data Reviewed: CBC: Recent Labs  Lab 11/01/22 2247 11/02/22 0456  WBC 17.6* 13.4*  NEUTROABS 12.5*  --   HGB 10.8* 10.0*  HCT 32.7* 30.6*  MCV 94.5 93.6  PLT 114* 100*   Basic Metabolic Panel: Recent Labs  Lab 11/01/22 2247 11/02/22 0123 11/02/22 0456 11/03/22 0320 11/04/22 0605  NA 132*  --  132* 135 140  K 4.1  --  4.6 4.0 4.0  CL 102  --  104 108 111  CO2 19*  --  18* 17* 22  GLUCOSE 221*  --  252* 165* 96  BUN 60*  --  58* 83* 80*  CREATININE 2.31* 2.30* 2.15* 2.15* 1.75*  CALCIUM 8.0*  --  8.0* 7.9* 8.5*   GFR: Estimated Creatinine Clearance: 30.5 mL/min (A) (by C-G formula based on SCr of 1.75 mg/dL (H)). Liver Function Tests: Recent Labs  Lab 11/01/22 2247 11/02/22 0456  AST 20 15  ALT 14 13  ALKPHOS 67 66  BILITOT 3.2* 2.6*  PROT 7.0 6.4*  ALBUMIN 3.7 3.4*   No results for input(s): "LIPASE", "AMYLASE" in the last 168 hours. No results for input(s): "AMMONIA" in the last 168 hours. Coagulation Profile: Recent Labs  Lab 11/02/22 0456  INR 1.6*   Cardiac Enzymes: No results for input(s): "CKTOTAL", "CKMB", "CKMBINDEX", "TROPONINI" in the last 168 hours. BNP (last 3 results) No results for input(s): "PROBNP" in the last 8760 hours. HbA1C: No results for input(s): "HGBA1C" in the last 72 hours. CBG: No results for input(s): "GLUCAP" in the last 168 hours. Lipid Profile: No results for input(s): "CHOL", "HDL", "LDLCALC", "TRIG", "CHOLHDL", "LDLDIRECT" in the last 72 hours. Thyroid Function Tests: No results for input(s): "TSH", "T4TOTAL", "FREET4", "T3FREE", "THYROIDAB" in the last 72 hours. Anemia Panel: No results for input(s): "VITAMINB12", "FOLATE", "FERRITIN", "TIBC", "IRON", "RETICCTPCT" in the last 72 hours.  Urine analysis:    Component Value Date/Time   COLORURINE YELLOW (A) 11/02/2022 1136   APPEARANCEUR HAZY (A) 11/02/2022 1136    LABSPEC 1.011 11/02/2022 1136   PHURINE 5.0 11/02/2022 1136   GLUCOSEU NEGATIVE 11/02/2022 1136   HGBUR SMALL (A) 11/02/2022 1136   BILIRUBINUR NEGATIVE 11/02/2022 1136   KETONESUR NEGATIVE 11/02/2022 1136   PROTEINUR 30 (A) 11/02/2022 1136   NITRITE NEGATIVE 11/02/2022 1136   LEUKOCYTESUR NEGATIVE 11/02/2022 1136   Sepsis Labs: @LABRCNTIP (procalcitonin:4,lacticidven:4)  ) Recent Results (from the past 240 hour(s))  Resp panel by RT-PCR (RSV, Flu A&B, Covid) Anterior Nasal Swab     Status: None   Collection Time: 11/01/22 10:47 PM   Specimen: Anterior Nasal Swab  Result Value Ref Range Status   SARS Coronavirus 2 by RT PCR NEGATIVE NEGATIVE Final    Comment: (NOTE) SARS-CoV-2 target nucleic acids are NOT DETECTED.  The SARS-CoV-2 RNA is generally detectable in upper respiratory specimens during the acute phase of infection. The lowest concentration of SARS-CoV-2 viral copies this assay can detect is 138 copies/mL. A negative result does not preclude SARS-Cov-2 infection and should not be used as the sole basis for  treatment or other patient management decisions. A negative result may occur with  improper specimen collection/handling, submission of specimen other than nasopharyngeal swab, presence of viral mutation(s) within the areas targeted by this assay, and inadequate number of viral copies(<138 copies/mL). A negative result must be combined with clinical observations, patient history, and epidemiological information. The expected result is Negative.  Fact Sheet for Patients:  BloggerCourse.com  Fact Sheet for Healthcare Providers:  SeriousBroker.it  This test is no t yet approved or cleared by the [place] FDA and  has been authorized for detection and/or diagnosis of SARS-CoV-2 by FDA under an Emergency Use Authorization (EUA). This EUA will remain  in effect (meaning this test can be used) for the duration of  the COVID-19 declaration under Section 564(b)(1) of the Act, 21 U.S.C.section 360bbb-3(b)(1), unless the authorization is terminated  or revoked sooner.       Influenza A by PCR NEGATIVE NEGATIVE Final   Influenza B by PCR NEGATIVE NEGATIVE Final    Comment: (NOTE) The Xpert Xpress SARS-CoV-2/FLU/RSV plus assay is intended as an aid in the diagnosis of influenza from Nasopharyngeal swab specimens and should not be used as a sole basis for treatment. Nasal washings and aspirates are unacceptable for Xpert Xpress SARS-CoV-2/FLU/RSV testing.  Fact Sheet for Patients: BloggerCourse.com  Fact Sheet for Healthcare Providers: SeriousBroker.it  This test is not yet approved or cleared by the [place] FDA and has been authorized for detection and/or diagnosis of SARS-CoV-2 by FDA under an Emergency Use Authorization (EUA). This EUA will remain in effect (meaning this test can be used) for the duration of the COVID-19 declaration under Section 564(b)(1) of the Act, 21 U.S.C. section 360bbb-3(b)(1), unless the authorization is terminated or revoked.     Resp Syncytial Virus by PCR NEGATIVE NEGATIVE Final    Comment: (NOTE) Fact Sheet for Patients: BloggerCourse.com  Fact Sheet for Healthcare Providers: SeriousBroker.it  This test is not yet approved or cleared by the [place] FDA and has been authorized for detection and/or diagnosis of SARS-CoV-2 by FDA under an Emergency Use Authorization (EUA). This EUA will remain in effect (meaning this test can be used) for the duration of the COVID-19 declaration under Section 564(b)(1) of the Act, 21 U.S.C. section 360bbb-3(b)(1), unless the authorization is terminated or revoked.  Performed at [facility], [address] [place], [place] [address]   Culture, blood (Routine X 2) w Reflex to ID Panel     Status: None  (Preliminary result)   Collection Time: 11/02/22  1:27 AM   Specimen: BLOOD  Result Value Ref Range Status   Specimen Description BLOOD RIGHT FOREARM  Final   Special Requests   Final    BOTTLES DRAWN AEROBIC AND ANAEROBIC Blood Culture adequate volume   Culture   Final    NO GROWTH 3 DAYS Performed at [facility], [address], [place], [place] [address]    Report Status PENDING  Incomplete  Culture, blood (Routine X 2) w Reflex to ID Panel     Status: None (Preliminary result)   Collection Time: 11/02/22  1:31 AM   Specimen: BLOOD  Result Value Ref Range Status   Specimen Description BLOOD LEFT ASSIST CONTROL  Final   Special Requests   Final    BOTTLES DRAWN AEROBIC AND ANAEROBIC Blood Culture adequate volume   Culture   Final    NO GROWTH 3 DAYS Performed at [facility], [address] [place], [place] [address]  Report Status PENDING  Incomplete  MRSA Next Gen by PCR, Nasal     Status: None   Collection Time: 11/02/22  7:50 AM   Specimen: Nasal Mucosa; Nasal Swab  Result Value Ref Range Status   MRSA by PCR Next Gen NOT DETECTED NOT DETECTED Final    Comment: (NOTE) The GeneXpert MRSA Assay (FDA approved for NASAL specimens only), is one component of a comprehensive MRSA colonization surveillance program. It is not intended to diagnose MRSA infection nor to guide or monitor treatment for MRSA infections. Test performance is not FDA approved in patients less than 39 years old. Performed at [facility], [address] [place], [place] [address]   Expectorated Sputum Assessment w Gram Stain, Rflx to Resp Cult     Status: None   Collection Time: 11/04/22 10:15 PM   Specimen: Sputum  Result Value Ref Range Status   Specimen Description SPUTUM  Final   Special Requests NONE  Final   Sputum evaluation   Final    Sputum specimen not acceptable for testing.  Please recollect.   C/JANELLE ROSE@2344  11/04/22 RH Performed at [facility], [address], [place], [place] [address]    Report Status 11/04/2022 FINAL  Final      Studies: No results found.  Scheduled Meds:  acetylcysteine  4 mL Nebulization BID   ascorbic acid  500 mg Oral Daily   aspirin EC  81 mg Oral Daily   docusate sodium  100 mg Oral BID   dorzolamide-timolol  1 drop Both Eyes BID   DULoxetine  20 mg Oral Daily   ferrous sulfate  325 mg Oral Q breakfast   fluticasone furoate-vilanterol  1 puff Inhalation BID   heparin injection (subcutaneous)  5,000 Units Subcutaneous Q8H   ipratropium-albuterol  3 mL Nebulization Q6H WA   levofloxacin  250 mg Oral QHS   lidocaine  1 patch Transdermal Q24H   methylPREDNISolone (SOLU-MEDROL) injection  40 mg Intravenous BID   mirtazapine  30 mg Oral QHS   multivitamin with minerals  1 tablet Oral Daily   omega-3 acid ethyl esters  2 g Oral Daily   pantoprazole  40 mg Oral Daily   simvastatin  10 mg Oral Daily   sodium chloride flush  3 mL Intravenous Q12H   terazosin  5 mg Oral QHS   zinc sulfate  220 mg Oral Daily    Continuous Infusions:   LOS: 3 days     Darlin Drop, MD [facility] Pager [phone]  If 7PM-7AM, please contact night-coverage [email or web address] Password [facility] 11/05/2022, 12:06 PM

## 2022-11-05 NOTE — Progress Notes (Signed)
SATURATION QUALIFICATIONS: (This note is used to comply with regulatory documentation for home oxygen)  Patient Saturations on Room Air at Rest = 94%  Patient Saturations on Room Air while Ambulating = 92%  

## 2022-11-05 NOTE — Progress Notes (Signed)
Physical Therapy Treatment Patient Details Name: Jared Parker MRN: [id number] DOB: [birth year] Today's Date: 11/05/2022   History of Present Illness Pt is an 89 y/o M who presented with flu like symptoms, in respiratory distress and fatigued, workup confirmed PNA. PMH of bladder CA, squamous cell carcinoma, COPD, HTN, GERD, HLD, OA, DM.    PT Comments    Pt pleasant and motivated for therapy. Pt is mod I with all bed mobility tasks. He demonstrated good concentric and eccentric control with STS from varying surfaces/heights requiring only min guard and RW for safety. Pt able to ambulate 1 x 50 ft, 1 x 100 ft, 1 x 150 ft with RW and min guard for safety. Pt able to negotiate 4 stairs x 2 using only the L rail, min guard for safety. Pt's SpO2 was monitored throughout the session and are as follows with pt on room air: after 50 ft walk (93%), after 100 ft walk (92%), after 4 stairs x 2 (89-90%), after 150 ft walk (93%). Pt will benefit from continued PT services upon discharge to safely address deficits listed in patient problem list for decreased caregiver assistance and eventual return to PLOF.   Recommendations for follow up therapy are one component of a multi-disciplinary discharge planning process, led by the attending physician.  Recommendations may be updated based on patient status, additional functional criteria and insurance authorization.  Follow Up Recommendations       Assistance Recommended at Discharge Intermittent Supervision/Assistance  Patient can return home with the following A little help with walking and/or transfers;A little help with bathing/dressing/bathroom;Assistance with cooking/housework;Assist for transportation;Help with stairs or ramp for entrance   Equipment Recommendations  None recommended by PT    Recommendations for Other Services       Precautions / Restrictions Precautions Precautions: Fall Restrictions Weight Bearing Restrictions: No      Mobility  Bed Mobility Overal bed mobility: Needs Assistance Bed Mobility: Supine to Sit     Supine to sit: Modified independent (Device/Increase time)     General bed mobility comments: Pt able to sit EOB with no physical assistance.    Transfers Overall transfer level: Needs assistance Equipment used: Rolling walker (2 wheels) Transfers: Sit to/from Stand Sit to Stand: Min guard           General transfer comment: No physical assist needed, UE's on RW. Good eccentric control sitting to standard toilet.    Ambulation/Gait Ambulation/Gait assistance: Min guard Gait Distance (Feet): 1 x 50 feet, 1 x 100 feet, 1 x 150 feet Assistive device: Rolling walker (2 wheels) Gait Pattern/deviations: Step-through pattern, Decreased step length - left, Decreased step length - right Gait velocity: decreased     General Gait Details: Slower but consistent cadence. Min guard for safety.   Stairs Stairs: Yes Stairs assistance: Min guard Stair Management: One rail Left, Step to pattern, Forwards Number of Stairs: 2 x 4 stairs General stair comments: Step-to pattern with single UE on L rail. No instances of LOB or legs buckling.   Wheelchair Mobility    Modified Rankin (Stroke Patients Only)       Balance Overall balance assessment: Needs assistance Sitting-balance support: Feet supported Sitting balance-Leahy Scale: Good     Standing balance support: No upper extremity supported, Single extremity supported Standing balance-Leahy Scale: Good Standing balance comment: Pt able to stand at recliner for ~15 seconds no LOB.  Cognition Arousal/Alertness: Awake/alert Behavior During Therapy: WFL for tasks assessed/performed Overall Cognitive Status: Within Functional Limits for tasks assessed                                          Exercises      General Comments        Pertinent Vitals/Pain Pain  Assessment Pain Assessment: No/denies pain    Home Living                          Prior Function            PT Goals (current goals can now be found in the care plan section) Progress towards PT goals: Progressing toward goals    Frequency    Min 2X/week      PT Plan Current plan remains appropriate    Co-evaluation              AM-PAC PT "6 Clicks" Mobility   Outcome Measure  Help needed turning from your back to your side while in a flat bed without using bedrails?: None Help needed moving from lying on your back to sitting on the side of a flat bed without using bedrails?: None Help needed moving to and from a bed to a chair (including a wheelchair)?: A Little Help needed standing up from a chair using your arms (e.g., wheelchair or bedside chair)?: None Help needed to walk in hospital room?: A Little Help needed climbing 3-5 steps with a railing? : A Little 6 Click Score: 21    End of Session Equipment Utilized During Treatment: Gait belt Activity Tolerance: Patient tolerated treatment well Patient left: in chair;with chair alarm set;with call bell/phone within reach;with family/visitor present Nurse Communication: Mobility status;Other (comment) (SpO2 response to ambulation, stairs.) PT Visit Diagnosis: Muscle weakness (generalized) (M62.81);Other abnormalities of gait and mobility (R26.89)     Time: 2952-8413 (And, In: 1552, Out: 1622, with break for BM.) PT Time Calculation (min) (ACUTE ONLY): 8 min  Charges:                        Ajmal Kathan, SPT 11/05/22, 5:04 PM

## 2022-11-05 NOTE — Care Management Important Message (Signed)
Important Message  Patient Details  Name: Jared Parker MRN: [id number] Date of Birth: 08/11/1931   Medicare Important Message Given:  Yes     Olegario Messier A Shaqueta Casady 11/05/2022, 12:12 PM

## 2022-11-06 ENCOUNTER — Other Ambulatory Visit: Payer: Self-pay | Admitting: Student

## 2022-11-06 DIAGNOSIS — J441 Chronic obstructive pulmonary disease with (acute) exacerbation: Secondary | ICD-10-CM | POA: Diagnosis not present

## 2022-11-06 LAB — CULTURE, BLOOD (ROUTINE X 2)

## 2022-11-06 LAB — EXPECTORATED SPUTUM ASSESSMENT W GRAM STAIN, RFLX TO RESP C: Special Requests: NORMAL

## 2022-11-06 LAB — VITAMIN D 25 HYDROXY (VIT D DEFICIENCY, FRACTURES): Vit D, 25-Hydroxy: 17.71 ng/mL — ABNORMAL LOW (ref 30–100)

## 2022-11-06 MED ORDER — ALBUTEROL SULFATE HFA 108 (90 BASE) MCG/ACT IN AERS: 2.0000 | INHALATION_SPRAY | Freq: Four times a day (QID) | RESPIRATORY_TRACT | 2 refills | Status: AC | PRN

## 2022-11-06 MED ORDER — POLYSACCHARIDE IRON COMPLEX 150 MG PO CAPS: 150.0000 mg | ORAL_CAPSULE | Freq: Every day | ORAL | 2 refills | Status: AC

## 2022-11-06 MED ORDER — AMLODIPINE BESYLATE 5 MG PO TABS: 5.0000 mg | ORAL_TABLET | Freq: Every day | ORAL | 11 refills | Status: DC

## 2022-11-06 MED ORDER — VITAMIN D (ERGOCALCIFEROL) 1.25 MG (50000 UNIT) PO CAPS: 50000.0000 [IU] | ORAL_CAPSULE | ORAL | 0 refills | Status: AC

## 2022-11-06 MED ORDER — GUAIFENESIN ER 600 MG PO TB12: 600.0000 mg | ORAL_TABLET | Freq: Two times a day (BID) | ORAL | 0 refills | Status: AC

## 2022-11-06 MED ORDER — FLUTICASONE FUROATE-VILANTEROL 100-25 MCG/ACT IN AEPB: 1.0000 | INHALATION_SPRAY | Freq: Two times a day (BID) | RESPIRATORY_TRACT | 5 refills | Status: AC

## 2022-11-06 MED ORDER — ISOSORBIDE MONONITRATE ER 30 MG PO TB24: 30.0000 mg | ORAL_TABLET | Freq: Every day | ORAL | 2 refills | Status: AC | PRN

## 2022-11-06 MED ORDER — AMLODIPINE BESYLATE 5 MG PO TABS
5.0000 mg | ORAL_TABLET | Freq: Once | ORAL | Status: DC
  Filled 2022-11-06: qty 1

## 2022-11-06 NOTE — Progress Notes (Signed)
Mobility Specialist - Progress Note   11/06/22 0918  Mobility  Activity Ambulated with assistance in room;Stood at bedside;Dangled on edge of bed  Level of Assistance Standby assist, set-up cues, supervision of patient - no hands on  Assistive Device Front wheel walker  Distance Ambulated (ft) 5 ft  Activity Response Tolerated well  Mobility Referral Yes  $Mobility charge 1 Mobility  Mobility Specialist Start Time (ACUTE ONLY) 0857  Mobility Specialist Stop Time (ACUTE ONLY) 0905  Mobility Specialist Time Calculation (min) (ACUTE ONLY) 8 min   Pt requesting transfer from recliner to bed. Pt STS and ambulates to bed SBA with no LOB noted. Pt left in bed with needs in reach and bed alarm activated.   Terrilyn Saver  Mobility Specialist  11/06/22 9:20 AM

## 2022-11-06 NOTE — Discharge Summary (Signed)
[facility] Discharge Summary   Patient: Jared Parker [id number]  PCP: Marguerita Beards, MD  Date of admission: 11/01/2022   Date of discharge:  11/06/2022     Discharge Diagnoses:  Principal Problem:   COPD exacerbation (HCC) Active Problems:   Type II diabetes mellitus (HCC)   Essential hypertension   Hyperlipidemia   COPD with chronic bronchitis   AKI (acute kidney injury) (HCC)   Metabolic acidosis   COPD with acute exacerbation (HCC)   Troponin I above reference range   Thrombocytopenia (HCC)   Gastro-esophageal reflux disease without esophagitis   Gout   Recurrent major depressive disorder, in full remission (HCC)   Admitted From: Home Disposition:  Home with home health services  Recommendations for Outpatient Follow-up:  PCP: In 1 week Follow up LABS/TEST:     Diet recommendation: Regular diet  Activity: The patient is advised to gradually reintroduce usual activities, as tolerated  Discharge Condition: stable  Code Status: Full code   History of present illness: As per the H and P dictated on admission Hospital Course:  Jared Parker is a 89 y.o. male with medical history significant of COPD without need for supplementary oxygen, self reported asthma, who presented to the ED w/ increased SOB/cough.  Workup revealed COPD exacerbation, acute hypoxic respiratory failure.  IV Solu-Medrol was added on 11/04/2022 due to diffuse wheezing.  Currently on Levaquin for COPD exacerbation, multiple antibiotic allergies.   Assessment/Plan:  # COPD with acute exacerbation  Self-reported history of asthma, with likely exacerbation. Acute hypoxic respiratory failure, Resolved currently saturating well on room air SIRS d/t COPD on admission, vs sepsis d/t CAP (briefly tachycardic/tachypneic w/ leukocytosis POA) Not on oxygen supplementation at baseline, s/p supplemental O2 inhalation which was weaned off gradually.  Currently saturating well on room air.  S/p IV  Solu-Medrol, no more need of steroids.  S/p bronchodilators and Levaquin.  COPD improved.  Patient was discharged on Breo Ellipta inhaler 1 puff twice daily and albuterol as needed.  # AKI (acute kidney injury) (HCC) on baseline CKD3B Baseline creatinine appears to be 1.7 with GFR 37.  AKI Likely due to acute illness, reduced po intake.  Creatinine 2.15---1.5 gradually improved.  Follow with PCP to repeat BMP after 1 week.  Discontinued losartan, Lasix, chlorthalidone. # HTN, discontinued losartan, Lasix and chlorthalidone due to AKI.  Started Imdur 30 mg p.o. daily with holding parameters.  Patient stated that he is allergic to amlodipine.  Patient was advised to monitor BP at home and follow with PCP to titrate medications accordingly. # Metabolic acidosis, mild, due to AKI.  Resolved # Troponin I above reference range, most likely due to hypoxia and AKI, trend - flat, patient remained chest pain-free. # Thrombocytopenia: Likely due to infection. A.w mild anemia. Plts 142, repeat CBC after 1 to 2 weeks and follow with PCP.  # Iron deficiency anemia: Iron studies done on 11/02/2022, showing iron deficiency. Continue iron supplement and bowel regimen. # Resolved hyponatremia - resolved # Chronic anxiety/depression. Resume home Cymbalta, Remeron. # Vitamin D deficiency: started vitamin D 50,000 units p.o. weekly, follow with PCP to repeat vitamin D level after 3 to 6 months. Chronic stable conditions GERD - PPI HLD - statin  BPH - terazosin HLD - statin, omega-3  's Body mass index is 26.26 kg/m.  Nutrition Interventions:  - Patient was instructed, not to drive, operate heavy machinery, perform activities at heights, swimming or participation in water activities or provide baby sitting services while  on Pain, Sleep and Anxiety Medications; until his outpatient Physician has advised to do so again.  - Also recommended to not to take more than prescribed Pain, Sleep and Anxiety  Medications.  Patient was seen by physical therapy, who recommended Home health, which was arranged. On the day of the discharge the patient's vitals were stable, and no other acute medical condition were reported by patient. the patient was felt safe to be discharge at Home with Home health.  Consultants: None Procedures: None  Discharge Exam: General: Appear in no distress, no Rash; Oral Mucosa Clear, moist. Cardiovascular: S1 and S2 Present, no Murmur, Respiratory: normal respiratory effort, Bilateral Air entry present and no Crackles, no wheezes Abdomen: Bowel Sound present, Soft and no tenderness, no hernia Extremities: no Pedal edema, no calf tenderness Neurology: alert and oriented to time, place, and person affect appropriate.  Filed Weights   11/03/22 0424 11/04/22 0500 11/05/22 0415  Weight: 84.5 kg 85 kg 85.4 kg   Vitals:   11/06/22 1149 11/06/22 1200  BP:  132/60  Pulse:  69  Resp:  18  Temp:  98 F (36.7 C)  SpO2: 92% 95%    DISCHARGE MEDICATION: Allergies as of 11/06/2022       Reactions   Cephalosporins Hives   Ciprofloxacin Hives, Shortness Of Breath, Rash   Tolerates levofloxacin   Doxycycline Hives   Streptomycin Hives   Sulfa Antibiotics Hives   Amlodipine Hives   Per patient    Atenolol    Other reaction(s): Other (See Comments) Slowed his heart rate.   Penicillins Hives   Patient tolerates amoxicillin/clavulanate   Hydroxychloroquine Rash   Other reaction(s): Other (See Comments) unknown unknown Other reaction(s): Other (See Comments) unknown        Medication List     STOP taking these medications    Arnuity Ellipta 100 MCG/ACT Aepb Generic drug: Fluticasone Furoate   chlorthalidone 25 MG tablet Commonly known as: HYGROTON   furosemide 20 MG tablet Commonly known as: LASIX   losartan 50 MG tablet Commonly known as: COZAAR       TAKE these medications    albuterol 108 (90 Base) MCG/ACT inhaler Commonly known as:  VENTOLIN HFA Inhale 2 puffs into the lungs every 6 (six) hours as needed for wheezing or shortness of breath.   allopurinol 100 MG tablet Commonly known as: ZYLOPRIM Take 200 mg by mouth daily.   ascorbic acid 500 MG tablet Commonly known as: VITAMIN C Take 1 tablet (500 mg total) by mouth daily.   aspirin EC 81 MG tablet Take 81 mg by mouth daily.   docusate sodium 100 MG capsule Commonly known as: COLACE Take 100 mg by mouth daily as needed for mild constipation.   dorzolamide-timolol 2-0.5 % ophthalmic solution Commonly known as: COSOPT Place 1 drop into both eyes 2 (two) times daily.   DULoxetine 20 MG capsule Commonly known as: CYMBALTA Take 20 mg by mouth daily.   Fish Oil 1000 MG Caps Take 1,000 mg by mouth daily.   fluticasone furoate-vilanterol 100-25 MCG/ACT Aepb Commonly known as: BREO ELLIPTA Inhale 1 puff into the lungs 2 (two) times daily.   guaiFENesin 600 MG 12 hr tablet Commonly known as: Mucinex Take 1 tablet (600 mg total) by mouth 2 (two) times daily for 7 days.   iron polysaccharides 150 MG capsule Commonly known as: NIFEREX Take 1 capsule (150 mg total) by mouth daily.   isosorbide mononitrate 30 MG 24 hr tablet Commonly known as:  IMDUR Take 1 tablet (30 mg total) by mouth daily as needed (Systolic BP >140).   mirtazapine 30 MG tablet Commonly known as: REMERON Take 30 mg by mouth at bedtime.   multivitamin with minerals Tabs tablet Take 1 tablet by mouth daily.   nystatin-triamcinolone cream Commonly known as: MYCOLOG II Apply 1 Application topically 2 (two) times daily.   omeprazole 20 MG capsule Commonly known as: PRILOSEC Take 20 mg by mouth daily.   oxyCODONE 5 MG immediate release tablet Commonly known as: Oxy IR/ROXICODONE Take 5-10 mg by mouth every 6 (six) hours as needed for moderate pain or severe pain.   polyethylene glycol 17 g packet Commonly known as: MIRALAX / GLYCOLAX Take 17 g by mouth daily as needed for mild  constipation or moderate constipation.   simvastatin 10 MG tablet Commonly known as: ZOCOR Take 10 mg by mouth daily.   terazosin 5 MG capsule Commonly known as: HYTRIN Take 5 mg by mouth at bedtime.   zinc sulfate 220 (50 Zn) MG capsule Take 1 capsule (220 mg total) by mouth daily.       Allergies  Allergen Reactions   Cephalosporins Hives   Ciprofloxacin Hives, Shortness Of Breath and Rash    Tolerates levofloxacin   Doxycycline Hives   Streptomycin Hives   Sulfa Antibiotics Hives   Amlodipine Hives    Per patient    Atenolol     Other reaction(s): Other (See Comments) Slowed his heart rate.   Penicillins Hives    Patient tolerates amoxicillin/clavulanate   Hydroxychloroquine Rash    Other reaction(s): Other (See Comments) unknown unknown Other reaction(s): Other (See Comments) unknown   Discharge Instructions     Call MD for:  difficulty breathing, headache or visual disturbances   Complete by: As directed    Call MD for:  extreme fatigue   Complete by: As directed    Call MD for:  persistant dizziness or light-headedness   Complete by: As directed    Call MD for:  persistant nausea and vomiting   Complete by: As directed    Call MD for:  severe uncontrolled pain   Complete by: As directed    Call MD for:  temperature >100.4   Complete by: As directed    Diet - low sodium heart healthy   Complete by: As directed    Discharge instructions   Complete by: As directed    F/u with PCP in 1 wk   Increase activity slowly   Complete by: As directed        The results of significant diagnostics from this hospitalization (including imaging, microbiology, ancillary and laboratory) are listed below for reference.    Significant Diagnostic Studies: DG Chest Portable 1 View  Result Date: 11/01/2022 CLINICAL DATA:  Shortness of breath EXAM: PORTABLE CHEST 1 VIEW COMPARISON:  12/20/2019 FINDINGS: Cardiac shadow is within normal limits. Aortic calcifications are  seen. Lungs are well aerated bilaterally. No focal infiltrate or effusion is seen. No acute bony abnormality is noted. IMPRESSION: No active disease. Electronically Signed   By: Alcide Clever M.D.   On: 11/01/2022 23:17    Microbiology: Recent Results (from the past 240 hour(s))  Resp panel by RT-PCR (RSV, Flu A&B, Covid) Anterior Nasal Swab     Status: None   Collection Time: 11/01/22 10:47 PM   Specimen: Anterior Nasal Swab  Result Value Ref Range Status   SARS Coronavirus 2 by RT PCR NEGATIVE NEGATIVE Final    Comment: (  NOTE) SARS-CoV-2 target nucleic acids are NOT DETECTED.  The SARS-CoV-2 RNA is generally detectable in upper respiratory specimens during the acute phase of infection. The lowest concentration of SARS-CoV-2 viral copies this assay can detect is 138 copies/mL. A negative result does not preclude SARS-Cov-2 infection and should not be used as the sole basis for treatment or other patient management decisions. A negative result may occur with  improper specimen collection/handling, submission of specimen other than nasopharyngeal swab, presence of viral mutation(s) within the areas targeted by this assay, and inadequate number of viral copies(<138 copies/mL). A negative result must be combined with clinical observations, patient history, and epidemiological information. The expected result is Negative.  Fact Sheet for Patients:  BloggerCourse.com  Fact Sheet for Healthcare Providers:  SeriousBroker.it  This test is no t yet approved or cleared by the [place] FDA and  has been authorized for detection and/or diagnosis of SARS-CoV-2 by FDA under an Emergency Use Authorization (EUA). This EUA will remain  in effect (meaning this test can be used) for the duration of the COVID-19 declaration under Section 564(b)(1) of the Act, 21 U.S.C.section 360bbb-3(b)(1), unless the authorization is terminated  or revoked  sooner.       Influenza A by PCR NEGATIVE NEGATIVE Final   Influenza B by PCR NEGATIVE NEGATIVE Final    Comment: (NOTE) The Xpert Xpress SARS-CoV-2/FLU/RSV plus assay is intended as an aid in the diagnosis of influenza from Nasopharyngeal swab specimens and should not be used as a sole basis for treatment. Nasal washings and aspirates are unacceptable for Xpert Xpress SARS-CoV-2/FLU/RSV testing.  Fact Sheet for Patients: BloggerCourse.com  Fact Sheet for Healthcare Providers: SeriousBroker.it  This test is not yet approved or cleared by the [place] FDA and has been authorized for detection and/or diagnosis of SARS-CoV-2 by FDA under an Emergency Use Authorization (EUA). This EUA will remain in effect (meaning this test can be used) for the duration of the COVID-19 declaration under Section 564(b)(1) of the Act, 21 U.S.C. section 360bbb-3(b)(1), unless the authorization is terminated or revoked.     Resp Syncytial Virus by PCR NEGATIVE NEGATIVE Final    Comment: (NOTE) Fact Sheet for Patients: BloggerCourse.com  Fact Sheet for Healthcare Providers: SeriousBroker.it  This test is not yet approved or cleared by the [place] FDA and has been authorized for detection and/or diagnosis of SARS-CoV-2 by FDA under an Emergency Use Authorization (EUA). This EUA will remain in effect (meaning this test can be used) for the duration of the COVID-19 declaration under Section 564(b)(1) of the Act, 21 U.S.C. section 360bbb-3(b)(1), unless the authorization is terminated or revoked.  Performed at [facility], [address] [place], [place] [address]   Culture, blood (Routine X 2) w Reflex to ID Panel     Status: None (Preliminary result)   Collection Time: 11/02/22  1:27 AM   Specimen: BLOOD  Result Value Ref Range Status   Specimen Description BLOOD RIGHT  FOREARM  Final   Special Requests   Final    BOTTLES DRAWN AEROBIC AND ANAEROBIC Blood Culture adequate volume   Culture   Final    NO GROWTH 4 DAYS Performed at [facility], [address], [place], [place] [address]    Report Status PENDING  Incomplete  Culture, blood (Routine X 2) w Reflex to ID Panel     Status: None (Preliminary result)   Collection Time: 11/02/22  1:31 AM   Specimen: BLOOD  Result Value Ref  Range Status   Specimen Description BLOOD LEFT ASSIST CONTROL  Final   Special Requests   Final    BOTTLES DRAWN AEROBIC AND ANAEROBIC Blood Culture adequate volume   Culture   Final    NO GROWTH 4 DAYS Performed at [facility], [address], [place], [place] [address]    Report Status PENDING  Incomplete  MRSA Next Gen by PCR, Nasal     Status: None   Collection Time: 11/02/22  7:50 AM   Specimen: Nasal Mucosa; Nasal Swab  Result Value Ref Range Status   MRSA by PCR Next Gen NOT DETECTED NOT DETECTED Final    Comment: (NOTE) The GeneXpert MRSA Assay (FDA approved for NASAL specimens only), is one component of a comprehensive MRSA colonization surveillance program. It is not intended to diagnose MRSA infection nor to guide or monitor treatment for MRSA infections. Test performance is not FDA approved in patients less than 50 years old. Performed at [facility], [address] [place], [place] [address]   Expectorated Sputum Assessment w Gram Stain, Rflx to Resp Cult     Status: None   Collection Time: 11/04/22 10:15 PM   Specimen: Sputum  Result Value Ref Range Status   Specimen Description SPUTUM  Final   Special Requests NONE  Final   Sputum evaluation   Final    Sputum specimen not acceptable for testing.  Please recollect.   C/JANELLE ROSE@2344  11/04/22 RH Performed at [facility], [address] [place], [place] [address]    Report Status 11/04/2022 FINAL  Final  Expectorated Sputum Assessment w Gram Stain,  Rflx to Resp Cult     Status: None   Collection Time: 11/05/22  9:00 PM   Specimen: Expectorated Sputum  Result Value Ref Range Status   Specimen Description EXPECTORATED SPUTUM  Final   Special Requests Normal  Final   Sputum evaluation   Final    THIS SPECIMEN IS ACCEPTABLE FOR SPUTUM CULTURE Performed at [facility], [address] [place], [place] [address]    Report Status 11/06/2022 FINAL  Final     Labs: CBC: Recent Labs  Lab 11/01/22 2247 11/02/22 0456 11/05/22 1239  WBC 17.6* 13.4* 13.6*  NEUTROABS 12.5*  --   --   HGB 10.8* 10.0* 11.5*  HCT 32.7* 30.6* 35.0*  MCV 94.5 93.6 94.1  PLT 114* 100* 142*   Basic Metabolic Panel: Recent Labs  Lab 11/01/22 2247 11/02/22 0123 11/02/22 0456 11/03/22 0320 11/04/22 0605 11/05/22 1239  NA 132*  --  132* 135 140 138  K 4.1  --  4.6 4.0 4.0 4.7  CL 102  --  104 108 111 107  CO2 19*  --  18* 17* 22 23  GLUCOSE 221*  --  252* 165* 96 192*  BUN 60*  --  58* 83* 80* 64*  CREATININE 2.31* 2.30* 2.15* 2.15* 1.75* 1.50*  CALCIUM 8.0*  --  8.0* 7.9* 8.5* 8.4*  MG  --   --   --   --   --  2.3  PHOS  --   --   --   --   --  3.5   Liver Function Tests: Recent Labs  Lab 11/01/22 2247 11/02/22 0456  AST 20 15  ALT 14 13  ALKPHOS 67 66  BILITOT 3.2* 2.6*  PROT 7.0 6.4*  ALBUMIN 3.7 3.4*   No results for input(s): "LIPASE", "AMYLASE" in the last 168 hours. No results for  input(s): "AMMONIA" in the last 168 hours. Cardiac Enzymes: No results for input(s): "CKTOTAL", "CKMB", "CKMBINDEX", "TROPONINI" in the last 168 hours. BNP (last 3 results) Recent Labs    11/01/22 2247  BNP 256.3*   CBG: No results for input(s): "GLUCAP" in the last 168 hours.  Time spent: 35 minutes  Signed:  Gillis Santa  [facility]  11/06/2022 12:29 PM

## 2022-11-06 NOTE — Progress Notes (Signed)
Received Md order to discharge patient to home with home health.  I reviewed discharge instructions, follow up appointments, prescriptions and home meds with patient and he verbalized understanding.

## 2022-11-06 NOTE — TOC Transition Note (Signed)
Transition of Care [facility]) - CM/SW Discharge Note   Patient Details  Name: Jared Parker MRN: [id number] Date of Birth: 16-Feb-1932  Transition of Care [facility]) CM/SW Contact:  Garret Reddish, RN Phone Number: 11/06/2022, 12:26 PM   Clinical Narrative:     Chart reviewed.  Patient has orders for discharge.  Noted that patient has been arranged with [facility] for home health services.  [facility] will provide Home Health services for Home Health PT, OT, RN, and in home aide.    I have informed Kandee Keen with [facility] that patient has orders for discharge today.     I have informed staff nurse of the above information.      Final next level of care: Home w Home Health Services Barriers to Discharge: No Barriers Identified   Patient Goals and CMS Choice CMS Medicare.gov Compare Post Acute Care list provided to:: Patient Choice offered to / list presented to : Patient  Discharge Placement                      Patient and family notified of of transfer: 11/06/22  Discharge Plan and Services Additional resources added to the After Visit Summary for       Post Acute Care Choice: NA                    HH Arranged: RN, PT, OT, Nurse's Aide HH Agency: [facility] Date [facility] Agency Contacted: 11/06/22 Time HH Agency Contacted: 1130 Representative spoke with at [facility] Agency: Kandee Keen  Social Determinants of Health (SDOH) Interventions SDOH Screenings   Tobacco Use: Medium Risk (07/01/2022)     Readmission Risk Interventions    11/02/2022   12:03 PM  Readmission Risk Prevention Plan  Transportation Screening Complete  PCP or Specialist Appt within 3-5 Days Complete  Social Work Consult for Recovery Care Planning/Counseling Complete  Palliative Care Screening Not Applicable  Medication Review Ocean[employer]) Complete

## 2022-11-07 LAB — CULTURE, BLOOD (ROUTINE X 2): Culture: NO GROWTH

## 2022-11-07 LAB — CULTURE, RESPIRATORY W GRAM STAIN: Special Requests: NORMAL

## 2022-11-08 LAB — CULTURE, RESPIRATORY W GRAM STAIN: Culture: NORMAL

## 2023-01-10 ENCOUNTER — Ambulatory Visit (INDEPENDENT_AMBULATORY_CARE_PROVIDER_SITE_OTHER): Payer: Medicare HMO | Admitting: Podiatry

## 2023-01-10 ENCOUNTER — Encounter: Payer: Self-pay | Admitting: Podiatry

## 2023-01-10 DIAGNOSIS — E119 Type 2 diabetes mellitus without complications: Secondary | ICD-10-CM | POA: Diagnosis not present

## 2023-01-10 DIAGNOSIS — M79675 Pain in left toe(s): Secondary | ICD-10-CM | POA: Diagnosis not present

## 2023-01-10 DIAGNOSIS — B351 Tinea unguium: Secondary | ICD-10-CM | POA: Diagnosis not present

## 2023-01-10 DIAGNOSIS — M79674 Pain in right toe(s): Secondary | ICD-10-CM

## 2023-01-15 NOTE — Progress Notes (Signed)
  Subjective:  Patient ID: Jared Parker, male    DOB: [birth year],  MRN: [id number]  JAMONTAE SEAGROVES presents to clinic today for preventative diabetic foot care and painful elongated mycotic toenails 1-5 bilaterally which are tender when wearing enclosed shoe gear. Pain is relieved with periodic professional debridement. Patient states since his last visit, he was treated for cellulitis of his right foot with oral antibiotics at [facility]. He voices no new pedal concerns on today's visit. Chief Complaint  Patient presents with   Nail Problem    DFC,Referring Provider Marguerita Beards, MD,lov:08/24  A1C:5.0,BS:no daily finger sticks    New problem(s): None.   PCP is Marguerita Beards, MD.  Allergies  Allergen Reactions   Cephalosporins Hives   Ciprofloxacin Hives, Shortness Of Breath and Rash    Tolerates levofloxacin   Doxycycline Hives   Streptomycin Hives   Sulfa Antibiotics Hives   Amlodipine Hives    Per patient    Atenolol     Other reaction(s): Other (See Comments) Slowed his heart rate.   Penicillins Hives    Patient tolerates amoxicillin/clavulanate   Hydroxychloroquine Rash    Other reaction(s): Other (See Comments) unknown unknown Other reaction(s): Other (See Comments) unknown    Review of Systems: Negative except as noted in the HPI.  Objective: No changes noted in today's physical examination. There were no vitals filed for this visit. [name] is a pleasant 89 y.o. male WD, WN in NAD. AAO x 3.  Vascular Examination: Capillary refill time immediate b/l. Vascular status intact b/l with palpable pedal pulses. Pedal hair absent b/l. No pain with calf compression b/l. Skin temperature gradient WNL b/l. No cyanosis or clubbing b/l. No ischemia or gangrene noted b/l. +1 pitting edema bilateral ankles.  Neurological Examination: Sensation grossly intact b/l with 10 gram monofilament. Vibratory sensation intact b/l.   Dermatological Examination: Pedal  skin with normal turgor, texture and tone b/l.  No open wounds. No interdigital macerations.   Toenails 1-5 b/l thick, discolored, elongated with subungual debris and pain on dorsal palpation.   No corns, calluses nor porokeratotic lesions noted.  Musculoskeletal Examination: Muscle strength 5/5 to all lower extremity muscle groups bilaterally. No pain, crepitus or joint limitation noted with ROM bilateral LE. No gross bony deformities bilaterally. Patient ambulates independent of any assistive aids.  Radiographs: None  Assessment/Plan: 1. Pain due to onychomycosis of toenails of both feet   2. Type 2 diabetes mellitus without complication, without long-term current use of insulin (HCC)     Patient was evaluated and treated. All patient's and/or POA's questions/concerns addressed on today's visit. Toenails 1-5 debrided in length and girth without incident. Continue soft, supportive shoe gear daily. Report any pedal injuries to medical professional. Call office if there are any questions/concerns. -Patient/POA to call should there be question/concern in the interim.   Return in about 3 months (around 04/12/2023).  Freddie Breech, DPM

## 2023-04-21 ENCOUNTER — Ambulatory Visit: Payer: Medicare HMO | Admitting: Podiatry

## 2023-04-21 ENCOUNTER — Encounter: Payer: Self-pay | Admitting: Podiatry

## 2023-04-21 DIAGNOSIS — B351 Tinea unguium: Secondary | ICD-10-CM

## 2023-04-21 DIAGNOSIS — E119 Type 2 diabetes mellitus without complications: Secondary | ICD-10-CM

## 2023-04-21 DIAGNOSIS — M79675 Pain in left toe(s): Secondary | ICD-10-CM

## 2023-04-21 DIAGNOSIS — M79674 Pain in right toe(s): Secondary | ICD-10-CM

## 2023-04-21 NOTE — Progress Notes (Signed)
  Subjective:  Patient ID: Jared Parker, male    DOB: [birth year],  MRN: [id number]  89 y.o. male presents preventative diabetic foot care and painful thick toenails that are difficult to trim. Pain interferes with ambulation. Aggravating factors include wearing enclosed shoe gear. Pain is relieved with periodic professional debridement.  Chief Complaint  Patient presents with   Diabetes    "Cut my nails."   New problem(s): None   PCP is Marguerita Beards, MD , and last visit was March 09, 2023.  Allergies  Allergen Reactions   Cephalosporins Hives   Ciprofloxacin Hives, Shortness Of Breath and Rash    Tolerates levofloxacin   Doxycycline Hives   Streptomycin Hives   Sulfa Antibiotics Hives   Amlodipine Hives    Per patient    Atenolol     Other reaction(s): Other (See Comments) Slowed his heart rate.   Penicillins Hives    Patient tolerates amoxicillin/clavulanate   Hydroxychloroquine Rash    Other reaction(s): Other (See Comments) unknown unknown Other reaction(s): Other (See Comments) unknown    Review of Systems: Negative except as noted in the HPI.   Objective:  Jared Parker is a pleasant 89 y.o. male in NAD. AAO x 3.  Vascular Examination: Vascular status intact b/l with palpable pedal pulses. CFT immediate b/l. Pedal hair absent. +1 Edema of ankles b/l. No pain with calf compression b/l. Skin temperature gradient WNL b/l. No varicosities noted. No cyanosis or clubbing noted.  Neurological Examination: Sensation grossly intact b/l with 10 gram monofilament. Vibratory sensation intact b/l.  Dermatological Examination: Pedal skin with normal turgor, texture and tone b/l. No open wounds nor interdigital macerations noted. Toenails 1-5 b/l thick, discolored, elongated with subungual debris and pain on dorsal palpation. No hyperkeratotic lesions noted b/l.   Musculoskeletal Examination: Muscle strength 5/5 to b/l LE.  No pain, crepitus noted b/l. No gross pedal  deformities. Patient ambulates independently without assistive aids.   Radiographs: None  Last A1c:       No data to display          Assessment:   1. Pain due to onychomycosis of toenails of both feet   2. Type 2 diabetes mellitus without complication, without long-term current use of insulin (HCC)    Plan:  -Consent given for treatment as described below: -Examined patient. -Continue foot and shoe inspections daily. Monitor blood glucose per PCP/Endocrinologist's recommendations. -Continue supportive shoe gear daily. -Mycotic toenails 1-5 bilaterally were debrided in length and girth with sterile nail nippers and dremel without incident. -Patient/POA to call should there be question/concern in the interim.  Return in about 3 months (around 07/20/2023).  Freddie Breech, DPM      [place] LOCATION: [address] [place] [address]                   Office [phone]   [facility] LOCATION: [address] [place], [place] [address] Office [phone]

## 2023-07-21 ENCOUNTER — Encounter: Payer: Self-pay | Admitting: Podiatry

## 2023-07-21 ENCOUNTER — Ambulatory Visit (INDEPENDENT_AMBULATORY_CARE_PROVIDER_SITE_OTHER): Payer: Medicare HMO | Admitting: Podiatry

## 2023-07-21 DIAGNOSIS — B351 Tinea unguium: Secondary | ICD-10-CM | POA: Diagnosis not present

## 2023-07-21 DIAGNOSIS — M79675 Pain in left toe(s): Secondary | ICD-10-CM | POA: Diagnosis not present

## 2023-07-21 DIAGNOSIS — E119 Type 2 diabetes mellitus without complications: Secondary | ICD-10-CM

## 2023-07-21 DIAGNOSIS — R600 Localized edema: Secondary | ICD-10-CM | POA: Diagnosis not present

## 2023-07-21 DIAGNOSIS — M79674 Pain in right toe(s): Secondary | ICD-10-CM | POA: Diagnosis not present

## 2023-07-26 ENCOUNTER — Ambulatory Visit (INDEPENDENT_AMBULATORY_CARE_PROVIDER_SITE_OTHER): Admitting: Podiatry

## 2023-07-26 DIAGNOSIS — M62469 Contracture of muscle, unspecified lower leg: Secondary | ICD-10-CM | POA: Diagnosis not present

## 2023-07-26 DIAGNOSIS — M722 Plantar fascial fibromatosis: Secondary | ICD-10-CM

## 2023-07-26 NOTE — Progress Notes (Signed)
 Subjective:  Patient ID: Jared Parker, male    DOB: 03-23-1932,  MRN: [id number]  Chief Complaint  Patient presents with   Foot Pain    Bilateral burning sensation in the feet     89 y.o. male presents with the above complaint.  Patient presents with bilateral plantar fasciitis pain hurts with ambulation worse with pressure he has not seen anyone as prior to seeing me pain scale 7 out of 10 dull aching nature.  There is burning sensation associate with it.  She would like he would like to discuss treatment options for it.  Denies any other acute complaints   Review of Systems: Negative except as noted in the HPI. Denies N/V/F/Ch.  Past Medical History:  Diagnosis Date   Actinic keratosis    Bladder cancer (HCC)    a. DX 1998 w/ recurrences in 2000, 2001, 2002, 2006, 12/2010, 06/2011, 12/2012, & 08/2013;  b. 06/2011 s/p TURBT;  c. Quarterly cystoscopies @ [facility], last 01/2015 ->nl.   Cataracts, bilateral    Colon polyps    COPD (chronic obstructive pulmonary disease) (HCC)    Essential hypertension    GERD (gastroesophageal reflux disease)    Gout    History of tobacco abuse    Hyperlipidemia    Insomnia    Osteoarthritis    Prostate hypertrophy    Retinal detachment    Seborrheic dermatitis    Squamous cell carcinoma    Thrombocytopenia (HCC)    Type II diabetes mellitus (HCC)     Current Outpatient Medications:    albuterol (VENTOLIN HFA) 108 (90 Base) MCG/ACT inhaler, Inhale 2 puffs into the lungs every 6 (six) hours as needed for wheezing or shortness of breath., Disp: 8 g, Rfl: 2   allopurinol (ZYLOPRIM) 100 MG tablet, Take 200 mg by mouth daily. , Disp: , Rfl:    ascorbic acid (VITAMIN C) 500 MG tablet, Take 1 tablet (500 mg total) by mouth daily., Disp: 90 tablet, Rfl: 0   aspirin EC 81 MG tablet, Take 81 mg by mouth daily. (Patient not taking: Reported on 04/21/2023), Disp: , Rfl:    docusate sodium (COLACE) 100 MG capsule, Take 100 mg by mouth daily as needed for mild  constipation. , Disp: , Rfl:    dorzolamide-timolol (COSOPT) 2-0.5 % ophthalmic solution, Place 1 drop into both eyes 2 (two) times daily., Disp: , Rfl:    DULoxetine (CYMBALTA) 20 MG capsule, Take 20 mg by mouth daily., Disp: , Rfl:    iron polysaccharides (NIFEREX) 150 MG capsule, Take 1 capsule (150 mg total) by mouth daily., Disp: 30 capsule, Rfl: 2   isosorbide mononitrate (IMDUR) 30 MG 24 hr tablet, Take 1 tablet (30 mg total) by mouth daily as needed (Systolic BP >140)., Disp: 30 tablet, Rfl: 2   mirtazapine (REMERON) 30 MG tablet, Take 30 mg by mouth at bedtime., Disp: , Rfl:    Multiple Vitamin (MULTIVITAMIN WITH MINERALS) TABS tablet, Take 1 tablet by mouth daily., Disp: 90 tablet, Rfl: 0   nystatin-triamcinolone (MYCOLOG II) cream, Apply 1 Application topically 2 (two) times daily., Disp: , Rfl:    Omega-3 Fatty Acids (FISH OIL) 1000 MG CAPS, Take 1,000 mg by mouth daily. , Disp: , Rfl:    omeprazole (PRILOSEC) 20 MG capsule, Take 20 mg by mouth daily. , Disp: , Rfl:    oxyCODONE (OXY IR/ROXICODONE) 5 MG immediate release tablet, Take 5-10 mg by mouth every 6 (six) hours as needed for moderate pain or severe pain. ,  Disp: , Rfl:    polyethylene glycol (MIRALAX / GLYCOLAX) 17 g packet, Take 17 g by mouth daily as needed for mild constipation or moderate constipation., Disp: 14 each, Rfl: 0   simvastatin (ZOCOR) 10 MG tablet, Take 10 mg by mouth daily., Disp: , Rfl:    terazosin (HYTRIN) 5 MG capsule, Take 5 mg by mouth at bedtime., Disp: , Rfl:    zinc sulfate 220 (50 Zn) MG capsule, Take 1 capsule (220 mg total) by mouth daily., Disp: 90 capsule, Rfl: 0  Social History   Tobacco Use  Smoking Status Former   Current packs/day: 0.00   Average packs/day: 2.0 packs/day for 40.0 years (80.0 ttl pk-yrs)   Types: Cigarettes   Start date: 03/24/1950   Quit date: 03/24/1990   Years since quitting: 33.3  Smokeless Tobacco Never    Allergies  Allergen Reactions   Cephalosporins Hives    Ciprofloxacin Hives, Shortness Of Breath and Rash    Tolerates levofloxacin   Doxycycline Hives   Streptomycin Hives   Sulfa Antibiotics Hives   Amlodipine Hives    Per patient    Atenolol     Other reaction(s): Other (See Comments) Slowed his heart rate.   Penicillins Hives    Patient tolerates amoxicillin/clavulanate   Hydroxychloroquine Rash    Other reaction(s): Other (See Comments) unknown unknown Other reaction(s): Other (See Comments) unknown   Objective:  There were no vitals filed for this visit. There is no height or weight on file to calculate BMI. Constitutional Well developed. Well nourished.  Vascular Dorsalis pedis pulses palpable bilaterally. Posterior tibial pulses palpable bilaterally. Capillary refill normal to all digits.  No cyanosis or clubbing noted. Pedal hair growth normal.  Neurologic Normal speech. Oriented to person, place, and time. Epicritic sensation to light touch grossly present bilaterally.  Dermatologic Nails well groomed and normal in appearance. No open wounds. No skin lesions.  Orthopedic: Normal joint ROM without pain or crepitus bilaterally. No visible deformities. Tender to palpation at the calcaneal tuber bilaterally. No pain with calcaneal squeeze bilaterally. Ankle ROM diminished range of motion bilaterally. Silfverskiold Test: positive bilaterally.   Radiographs:   Assessment:   1. Plantar fasciitis of right foot   2. Plantar fasciitis of left foot   3. Gastrocnemius equinus, unspecified laterality    Plan:  Patient was evaluated and treated and all questions answered.  Plantar Fasciitis, bilaterally with underlying gastrocnemius equinus - XR reviewed as above.  - Educated on icing and stretching. Instructions given.  - Injection delivered to the plantar fascia as below. - DME: Plantar fascial brace dispensed to support the medial longitudinal arch of the foot and offload pressure from the heel and prevent arch  collapse during weightbearing - Pharmacologic management: None  Procedure: Injection Tendon/Ligament Location: Bilateral plantar fascia at the glabrous junction; medial approach. Skin Prep: alcohol Injectate: 0.5 cc 0.5% marcaine plain, 0.5 cc of 1% Lidocaine, 0.5 cc kenalog 10. Disposition: Patient tolerated procedure well. Injection site dressed with a band-aid.  No follow-ups on file.

## 2023-07-26 NOTE — Progress Notes (Signed)
 ANNUAL DIABETIC FOOT EXAM  Subjective: Jared Parker presents today for annual diabetic foot exam.  Patient confirms h/o diabetes.  Patient denies any h/o foot wounds.  Marguerita Beards, MD is patient's PCP.  Past Medical History:  Diagnosis Date   Actinic keratosis    Bladder cancer (HCC)    a. DX 1998 w/ recurrences in 2000, 2001, 2002, 2006, 12/2010, 06/2011, 12/2012, & 08/2013;  b. 06/2011 s/p TURBT;  c. Quarterly cystoscopies @ [facility], last 01/2015 ->nl.   Cataracts, bilateral    Colon polyps    COPD (chronic obstructive pulmonary disease) (HCC)    Essential hypertension    GERD (gastroesophageal reflux disease)    Gout    History of tobacco abuse    Hyperlipidemia    Insomnia    Osteoarthritis    Prostate hypertrophy    Retinal detachment    Seborrheic dermatitis    Squamous cell carcinoma    Thrombocytopenia (HCC)    Type II diabetes mellitus (HCC)    Patient Active Problem List   Diagnosis Date Noted   Arthritis 11/03/2022   AKI (acute kidney injury) (HCC) 11/02/2022   Metabolic acidosis 11/02/2022   COPD with acute exacerbation (HCC) 11/02/2022   Troponin I above reference range 11/02/2022   Thrombocytopenia (HCC) 11/02/2022   COPD exacerbation (HCC) 11/02/2022   Pain due to onychomycosis of toenails of both feet 12/17/2021   Recurrent major depressive disorder, in full remission (HCC) 03/19/2021   Pneumonia due to COVID-19 virus 12/20/2019   Acute respiratory failure due to COVID-19 (HCC) 12/20/2019   COPD with chronic bronchitis (HCC) 12/20/2019   Patient has active power of attorney for health care 12/09/2017   History of MRSA infection 02/16/2017   Hearing loss 11/19/2016   Type II diabetes mellitus (HCC)    Essential hypertension    Hyperlipidemia    Gastro-esophageal reflux disease without esophagitis 02/10/2015   Gout 02/10/2015   Primary insomnia 05/27/2014   Benign neoplasm of colon 11/20/2012   Seborrheic dermatitis 12/16/2011   Malignant  neoplasm of bladder (HCC) 06/01/2011   Actinic keratosis 03/02/2011   Osteoarthritis of multiple joints 01/25/2003   Past Surgical History:  Procedure Laterality Date   Bladder cancer     Per pt nine surgeries   HERNIA REPAIR     Current Outpatient Medications on File Prior to Visit  Medication Sig Dispense Refill   albuterol (VENTOLIN HFA) 108 (90 Base) MCG/ACT inhaler Inhale 2 puffs into the lungs every 6 (six) hours as needed for wheezing or shortness of breath. 8 g 2   allopurinol (ZYLOPRIM) 100 MG tablet Take 200 mg by mouth daily.      ascorbic acid (VITAMIN C) 500 MG tablet Take 1 tablet (500 mg total) by mouth daily. 90 tablet 0   aspirin EC 81 MG tablet Take 81 mg by mouth daily. (Patient not taking: Reported on 04/21/2023)     docusate sodium (COLACE) 100 MG capsule Take 100 mg by mouth daily as needed for mild constipation.      dorzolamide-timolol (COSOPT) 2-0.5 % ophthalmic solution Place 1 drop into both eyes 2 (two) times daily.     DULoxetine (CYMBALTA) 20 MG capsule Take 20 mg by mouth daily.     iron polysaccharides (NIFEREX) 150 MG capsule Take 1 capsule (150 mg total) by mouth daily. 30 capsule 2   isosorbide mononitrate (IMDUR) 30 MG 24 hr tablet Take 1 tablet (30 mg total) by mouth daily as needed (Systolic BP >140).  30 tablet 2   mirtazapine (REMERON) 30 MG tablet Take 30 mg by mouth at bedtime.     Multiple Vitamin (MULTIVITAMIN WITH MINERALS) TABS tablet Take 1 tablet by mouth daily. 90 tablet 0   nystatin-triamcinolone (MYCOLOG II) cream Apply 1 Application topically 2 (two) times daily.     Omega-3 Fatty Acids (FISH OIL) 1000 MG CAPS Take 1,000 mg by mouth daily.      omeprazole (PRILOSEC) 20 MG capsule Take 20 mg by mouth daily.      oxyCODONE (OXY IR/ROXICODONE) 5 MG immediate release tablet Take 5-10 mg by mouth every 6 (six) hours as needed for moderate pain or severe pain.      polyethylene glycol (MIRALAX / GLYCOLAX) 17 g packet Take 17 g by mouth daily as  needed for mild constipation or moderate constipation. 14 each 0   simvastatin (ZOCOR) 10 MG tablet Take 10 mg by mouth daily.     terazosin (HYTRIN) 5 MG capsule Take 5 mg by mouth at bedtime.     zinc sulfate 220 (50 Zn) MG capsule Take 1 capsule (220 mg total) by mouth daily. 90 capsule 0   No current facility-administered medications on file prior to visit.    Allergies  Allergen Reactions   Cephalosporins Hives   Ciprofloxacin Hives, Shortness Of Breath and Rash    Tolerates levofloxacin   Doxycycline Hives   Streptomycin Hives   Sulfa Antibiotics Hives   Amlodipine Hives    Per patient    Atenolol     Other reaction(s): Other (See Comments) Slowed his heart rate.   Penicillins Hives    Patient tolerates amoxicillin/clavulanate   Hydroxychloroquine Rash    Other reaction(s): Other (See Comments) unknown unknown Other reaction(s): Other (See Comments) unknown   Social History   Occupational History   Not on file  Tobacco Use   Smoking status: Former    Current packs/day: 0.00    Average packs/day: 2.0 packs/day for 40.0 years (80.0 ttl pk-yrs)    Types: Cigarettes    Start date: 03/24/1950    Quit date: 03/24/1990    Years since quitting: 33.3   Smokeless tobacco: Never  Substance and Sexual Activity   Alcohol use: No    Alcohol/week: 0.0 standard drinks of alcohol   Drug use: No   Sexual activity: Never   Family History  Problem Relation Age of Onset   Diabetes Mother        died @ 57   Kidney failure Mother    Other Father        died @ 58 of black lung   Cancer Brother    Diabetes Brother    Other Other        two sisters - A & W.   Immunization History  Administered Date(s) Administered   Moderna Covid-19 Fall Seasonal Vaccine 15yrs & older 04/16/2022   Moderna Sars-Covid-2 Vaccination 08/19/2020   PFIZER(Purple Top)SARS-COV-2 Vaccination 07/06/2019, 07/27/2019, 03/28/2020   Pfizer Covid-19 Vaccine Bivalent Booster 31yrs & up 03/19/2021    Pfizer(Comirnaty)Fall Seasonal Vaccine 12 years and older 03/09/2023     Review of Systems: Negative except as noted in the HPI.   Objective: There were no vitals filed for this visit.  Jared Parker is a pleasant 89 y.o. male in NAD. AAO X 3.  Diabetic foot exam was performed with the following findings:   Vascular Examination: Capillary refill time immediate b/l. Trace edema b/l. Vascular status intact b/l with palpable pedal pulses.  Pedal hair present b/l. No pain with calf compression b/l. Skin temperature gradient WNL b/l. No cyanosis or clubbing b/l. No ischemia or gangrene noted b/l.   Neurological Examination: Sensation grossly intact b/l with 10 gram monofilament. Vibratory sensation absent b/l.  Dermatological Examination: Pedal skin with normal turgor, texture and tone b/l.  No open wounds. No interdigital macerations.   Toenails 1-5 b/l thick, discolored, elongated with subungual debris and pain on dorsal palpation. No corns, calluses nor porokeratotic lesions noted.  Musculoskeletal Examination: Muscle strength 5/5 to all lower extremity muscle groups bilaterally. No pain, crepitus or joint limitation noted with ROM bilateral LE. No gross bony deformities bilaterally.  Radiographs: None     ADA Risk Categorization: Low Risk :  Patient has all of the following: Intact protective sensation No prior foot ulcer  No severe deformity Pedal pulses present  Assessment: 1. Pain due to onychomycosis of toenails of both feet   2. Bilateral lower extremity edema   3. Type 2 diabetes mellitus without complication, without long-term current use of insulin (HCC)     Plan: Diabetic foot examination performed today. All patient's and/or POA's questions/concerns addressed on today's visit. Toenails 1-5 debrided in length and girth without incident. Continue foot and shoe inspections daily. Monitor blood glucose per PCP/Endocrinologist's recommendations. Continue soft,  supportive shoe gear daily. Report any pedal injuries to medical professional. Call office if there are any questions/concerns. -Patient/POA to call should there be question/concern in the interim. Return in about 11 weeks (around 10/06/2023).  Freddie Breech, DPM      [place] LOCATION: [address] [place] [address]                   Office [phone]   [facility] LOCATION: [address] [place], [place] [address] Office [phone]

## 2023-08-01 ENCOUNTER — Other Ambulatory Visit: Payer: Self-pay | Admitting: Ophthalmology

## 2023-08-01 DIAGNOSIS — G43109 Migraine with aura, not intractable, without status migrainosus: Secondary | ICD-10-CM

## 2023-08-13 ENCOUNTER — Encounter: Payer: Self-pay | Admitting: Ophthalmology

## 2023-08-17 ENCOUNTER — Inpatient Hospital Stay: Admission: RE | Admit: 2023-08-17 | Source: Ambulatory Visit

## 2023-08-23 ENCOUNTER — Ambulatory Visit (INDEPENDENT_AMBULATORY_CARE_PROVIDER_SITE_OTHER): Admitting: Podiatry

## 2023-08-23 DIAGNOSIS — M722 Plantar fascial fibromatosis: Secondary | ICD-10-CM | POA: Diagnosis not present

## 2023-08-23 DIAGNOSIS — M216X1 Other acquired deformities of right foot: Secondary | ICD-10-CM

## 2023-08-23 DIAGNOSIS — M62469 Contracture of muscle, unspecified lower leg: Secondary | ICD-10-CM | POA: Diagnosis not present

## 2023-08-23 MED ORDER — METHYLPREDNISOLONE 4 MG PO TBPK: ORAL_TABLET | ORAL | 0 refills | Status: AC

## 2023-08-23 MED ORDER — MELOXICAM 15 MG PO TABS: 15.0000 mg | ORAL_TABLET | Freq: Every day | ORAL | 0 refills | Status: DC

## 2023-08-23 NOTE — Progress Notes (Signed)
 Subjective:  Patient ID: Jared Parker, male    DOB: 03-25-1932,  MRN: [id number]  Chief Complaint  Patient presents with   Plantar Fasciitis    Pt stated that the injections only lasted for about 5 days he stated that he still has some pain in the heels of his feet     89 y.o. male presents with the above complaint.  Patient presents with bilateral plantar fasciitis pain hurts with ambulation worse with pressure he has not seen anyone as prior to seeing me pain scale 7 out of 10 dull aching nature.  There is burning sensation associate with it.  She would like he would like to discuss treatment options for it.  Denies any other acute complaints   Review of Systems: Negative except as noted in the HPI. Denies N/V/F/Ch.  Past Medical History:  Diagnosis Date   Actinic keratosis    Bladder cancer (HCC)    a. DX 1998 w/ recurrences in 2000, 2001, 2002, 2006, 12/2010, 06/2011, 12/2012, & 08/2013;  b. 06/2011 s/p TURBT;  c. Quarterly cystoscopies @ [facility], last 01/2015 ->nl.   Cataracts, bilateral    Colon polyps    COPD (chronic obstructive pulmonary disease) (HCC)    Essential hypertension    GERD (gastroesophageal reflux disease)    Gout    History of tobacco abuse    Hyperlipidemia    Insomnia    Osteoarthritis    Prostate hypertrophy    Retinal detachment    Seborrheic dermatitis    Squamous cell carcinoma    Thrombocytopenia (HCC)    Type II diabetes mellitus (HCC)     Current Outpatient Medications:    Accu-Chek Softclix Lancets lancets, Use to check blood sugar daily as instructed by clinic. E11.9, Disp: , Rfl:    acetic acid-hydrocortisone (VOSOL-HC) OTIC solution, Administer 3 drops into the left ear two (2) times a day., Disp: , Rfl:    amLODipine (NORVASC) 5 MG tablet, Take 1 tablet by mouth daily., Disp: , Rfl:    Blood Glucose Calibration (LIBERTY GLUCOSE CONTROL) Normal LIQD, , Disp: , Rfl:    Blood Glucose Monitoring Suppl (ACCU-CHEK GUIDE) w/Device KIT, as directed.,  Disp: , Rfl:    Blood Glucose Monitoring Suppl (GLUCOCOM BLOOD GLUCOSE MONITOR) DEVI, , Disp: , Rfl:    chlorthalidone (HYGROTON) 25 MG tablet, Take 25 mg by mouth every morning., Disp: , Rfl:    clotrimazole-betamethasone (LOTRISONE) cream, Apply topically 2 (two) times daily., Disp: , Rfl:    fexofenadine (ALLEGRA) 180 MG tablet, Take 1 tablet by mouth daily., Disp: , Rfl:    fluticasone furoate-vilanterol (BREO ELLIPTA) 100-25 MCG/ACT AEPB, Inhale 1 puff into the lungs daily., Disp: , Rfl:    hydrocortisone 2.5 % cream, Apply to rash sparingly as needed, Disp: , Rfl:    meloxicam (MOBIC) 15 MG tablet, Take 1 tablet (15 mg total) by mouth daily., Disp: 30 tablet, Rfl: 0   methylPREDNISolone (MEDROL DOSEPAK) 4 MG TBPK tablet, Take as directed, Disp: 21 each, Rfl: 0   naloxone (NARCAN) nasal spray 4 mg/0.1 mL, One spray in either nostril once for known/suspected opioid overdose. May repeat every 2-3 minutes in alternating nostril til EMS arrives, Disp: , Rfl:    temazepam (RESTORIL) 30 MG capsule, Take by mouth., Disp: , Rfl:    traZODone (DESYREL) 50 MG tablet, Take 50 mg by mouth at bedtime as needed., Disp: , Rfl:    albuterol (VENTOLIN HFA) 108 (90 Base) MCG/ACT inhaler, Inhale 2 puffs into  the lungs every 6 (six) hours as needed for wheezing or shortness of breath., Disp: 8 g, Rfl: 2   allopurinol (ZYLOPRIM) 100 MG tablet, Take 200 mg by mouth daily. , Disp: , Rfl:    ascorbic acid (VITAMIN C) 500 MG tablet, Take 1 tablet (500 mg total) by mouth daily., Disp: 90 tablet, Rfl: 0   aspirin EC 81 MG tablet, Take 81 mg by mouth daily. (Patient not taking: Reported on 04/21/2023), Disp: , Rfl:    docusate sodium (COLACE) 100 MG capsule, Take 100 mg by mouth daily as needed for mild constipation. , Disp: , Rfl:    dorzolamide-timolol (COSOPT) 2-0.5 % ophthalmic solution, Place 1 drop into both eyes 2 (two) times daily., Disp: , Rfl:    DULoxetine (CYMBALTA) 20 MG capsule, Take 20 mg by mouth daily.,  Disp: , Rfl:    furosemide (LASIX) 20 MG tablet, Take 20 mg by mouth daily as needed., Disp: , Rfl:    iron polysaccharides (NIFEREX) 150 MG capsule, Take 1 capsule (150 mg total) by mouth daily., Disp: 30 capsule, Rfl: 2   isosorbide mononitrate (IMDUR) 30 MG 24 hr tablet, Take 1 tablet (30 mg total) by mouth daily as needed (Systolic BP >140)., Disp: 30 tablet, Rfl: 2   losartan (COZAAR) 50 MG tablet, Take 50 mg by mouth daily., Disp: , Rfl:    mirtazapine (REMERON) 30 MG tablet, Take 30 mg by mouth at bedtime., Disp: , Rfl:    Multiple Vitamin (MULTIVITAMIN WITH MINERALS) TABS tablet, Take 1 tablet by mouth daily., Disp: 90 tablet, Rfl: 0   nystatin-triamcinolone (MYCOLOG II) cream, Apply 1 Application topically 2 (two) times daily., Disp: , Rfl:    Omega-3 Fatty Acids (FISH OIL) 1000 MG CAPS, Take 1,000 mg by mouth daily. , Disp: , Rfl:    omeprazole (PRILOSEC) 20 MG capsule, Take 20 mg by mouth daily. , Disp: , Rfl:    oxyCODONE (OXY IR/ROXICODONE) 5 MG immediate release tablet, Take 5-10 mg by mouth every 6 (six) hours as needed for moderate pain or severe pain. , Disp: , Rfl:    polyethylene glycol (MIRALAX / GLYCOLAX) 17 g packet, Take 17 g by mouth daily as needed for mild constipation or moderate constipation., Disp: 14 each, Rfl: 0   sertraline (ZOLOFT) 100 MG tablet, Take 100 mg by mouth daily., Disp: , Rfl:    simvastatin (ZOCOR) 10 MG tablet, Take 10 mg by mouth daily., Disp: , Rfl:    terazosin (HYTRIN) 5 MG capsule, Take 5 mg by mouth at bedtime., Disp: , Rfl:    TRUE METRIX BLOOD GLUCOSE TEST test strip, SMARTSIG:Via Meter, Disp: , Rfl:    WIXELA INHUB 500-50 MCG/ACT AEPB, 1 puff 2 (two) times daily., Disp: , Rfl:    zinc sulfate 220 (50 Zn) MG capsule, Take 1 capsule (220 mg total) by mouth daily., Disp: 90 capsule, Rfl: 0  Social History   Tobacco Use  Smoking Status Former   Current packs/day: 0.00   Average packs/day: 2.0 packs/day for 40.0 years (80.0 ttl pk-yrs)    Types: Cigarettes   Start date: 03/24/1950   Quit date: 03/24/1990   Years since quitting: 33.4  Smokeless Tobacco Never    Allergies  Allergen Reactions   Cephalosporins Hives   Ciprofloxacin Hives, Shortness Of Breath and Rash    Tolerates levofloxacin   Doxycycline Hives   Streptomycin Hives   Sulfa Antibiotics Hives   Amlodipine Hives    Per patient  Atenolol     Other reaction(s): Other (See Comments) Slowed his heart rate.   Penicillins Hives    Patient tolerates amoxicillin/clavulanate   Hydroxychloroquine Rash    Other reaction(s): Other (See Comments) unknown unknown Other reaction(s): Other (See Comments) unknown   Objective:  There were no vitals filed for this visit. There is no height or weight on file to calculate BMI. Constitutional Well developed. Well nourished.  Vascular Dorsalis pedis pulses palpable bilaterally. Posterior tibial pulses palpable bilaterally. Capillary refill normal to all digits.  No cyanosis or clubbing noted. Pedal hair growth normal.  Neurologic Normal speech. Oriented to person, place, and time. Epicritic sensation to light touch grossly present bilaterally.  Dermatologic Nails well groomed and normal in appearance. No open wounds. No skin lesions.  Orthopedic: Normal joint ROM without pain or crepitus bilaterally. No visible deformities. Tender to palpation at the calcaneal tuber bilaterally. No pain with calcaneal squeeze bilaterally. Ankle ROM diminished range of motion bilaterally. Silfverskiold Test: positive bilaterally.   Radiographs:   Assessment:   1. Plantar fasciitis of right foot   2. Plantar fasciitis of left foot   3. Gastrocnemius equinus, unspecified laterality     Plan:  Patient was evaluated and treated and all questions answered.  Plantar Fasciitis, bilaterally with underlying gastrocnemius equinus - XR reviewed as above.  - Educated on icing and stretching. Instructions given.  - Second  injection delivered to the plantar fascia as below. - DME: Continue plantar fascial brace - Pharmacologic management: Medrol Dosepak and meloxicam  Procedure: Injection Tendon/Ligament Location: Bilateral plantar fascia at the glabrous junction; medial approach. Skin Prep: alcohol Injectate: 0.5 cc 0.5% marcaine plain, 0.5 cc of 1% Lidocaine, 0.5 cc kenalog 10. Disposition: Patient tolerated procedure well. Injection site dressed with a band-aid.  No follow-ups on file.

## 2023-09-02 ENCOUNTER — Other Ambulatory Visit: Payer: Self-pay | Admitting: Internal Medicine

## 2023-09-02 DIAGNOSIS — M545 Low back pain, unspecified: Secondary | ICD-10-CM

## 2023-09-03 ENCOUNTER — Ambulatory Visit
Admission: RE | Admit: 2023-09-03 | Discharge: 2023-09-03 | Disposition: A | Discharge status: Home / Self Care | Source: Ambulatory Visit | Attending: Internal Medicine | Admitting: Internal Medicine

## 2023-09-03 DIAGNOSIS — M545 Low back pain, unspecified: Secondary | ICD-10-CM | POA: Diagnosis present

## 2023-09-09 ENCOUNTER — Ambulatory Visit
Admission: RE | Admit: 2023-09-09 | Discharge: 2023-09-09 | Disposition: A | Discharge status: Home / Self Care | Source: Ambulatory Visit | Attending: Ophthalmology | Admitting: Ophthalmology

## 2023-09-09 DIAGNOSIS — G43109 Migraine with aura, not intractable, without status migrainosus: Secondary | ICD-10-CM

## 2023-09-09 MED ORDER — GADOPICLENOL 0.5 MMOL/ML IV SOLN
10.0000 mL | Freq: Once | INTRAVENOUS | Status: AC | PRN
  Administered 2023-09-09: 10 mL via INTRAVENOUS

## 2023-09-13 ENCOUNTER — Ambulatory Visit: Admitting: Podiatry

## 2023-09-20 ENCOUNTER — Other Ambulatory Visit: Payer: Self-pay | Admitting: Podiatry

## 2023-10-03 ENCOUNTER — Encounter: Payer: Self-pay | Admitting: Podiatry

## 2023-10-03 ENCOUNTER — Ambulatory Visit (INDEPENDENT_AMBULATORY_CARE_PROVIDER_SITE_OTHER): Admitting: Podiatry

## 2023-10-03 DIAGNOSIS — B351 Tinea unguium: Secondary | ICD-10-CM

## 2023-10-03 DIAGNOSIS — M79674 Pain in right toe(s): Secondary | ICD-10-CM | POA: Diagnosis not present

## 2023-10-03 DIAGNOSIS — E119 Type 2 diabetes mellitus without complications: Secondary | ICD-10-CM | POA: Diagnosis not present

## 2023-10-03 DIAGNOSIS — M79675 Pain in left toe(s): Secondary | ICD-10-CM | POA: Diagnosis not present

## 2023-10-04 ENCOUNTER — Ambulatory Visit: Admitting: Podiatry

## 2023-10-09 NOTE — Progress Notes (Signed)
  Subjective:  Patient ID: Jared Parker, male    DOB: 07-11-1931,  MRN: [id number]  89 y.o. male presents preventative diabetic foot care and painful, elongated thickened toenails x 10 which are symptomatic when wearing enclosed shoe gear. This interferes with his/her daily activities. [place] states he has a brain tumor and is being worked up for it. Chief Complaint  Patient presents with   Diabetes    "Trim my nails."  Dr. Elise Guile - 09/12/2023; A1c - 6.4   New problem(s): None   PCP is Elise Guile, MD.  Allergies  Allergen Reactions   Cephalosporins Hives   Ciprofloxacin Hives, Shortness Of Breath and Rash    Tolerates levofloxacin    Doxycycline Hives   Streptomycin Hives   Sulfa Antibiotics Hives   Amlodipine  Hives    Per patient    Atenolol     Other reaction(s): Other (See Comments) Slowed his heart rate.   Penicillins Hives    Patient tolerates amoxicillin/clavulanate   Hydroxychloroquine Rash    Other reaction(s): Other (See Comments) unknown unknown Other reaction(s): Other (See Comments) unknown    Review of Systems: Negative except as noted in the HPI.   Objective:  Jared Parker is a pleasant 89 y.o. male in NAD. AAO x 3.  Vascular Examination: Vascular status intact b/l with palpable pedal pulses. CFT immediate b/l. Pedal hair present. No edema. No pain with calf compression b/l. Skin temperature gradient WNL b/l. No varicosities noted. No cyanosis or clubbing noted.  Neurological Examination: Sensation grossly intact b/l with 10 gram monofilament. Vibratory sensation intact b/l.  Dermatological Examination: Pedal skin with normal turgor, texture and tone b/l. No open wounds nor interdigital macerations noted. Toenails 1-5 b/l thick, discolored, elongated with subungual debris and pain on dorsal palpation. No hyperkeratotic lesions noted b/l.   Musculoskeletal Examination: Muscle strength 5/5 to b/l LE.  No pain, crepitus noted b/l. No gross  pedal deformities. Patient ambulates independently without assistive aids.   Radiographs: None  Last A1c:       No data to display           Assessment:   1. Pain due to onychomycosis of toenails of both feet   2. Type 2 diabetes mellitus without complication, without long-term current use of insulin  [facility])    Plan:  Consent given for treatment. Patient examined. All patient's and/or POA's questions/concerns addressed on today's visit. Mycotic toenails 1-5 debrided in length and girth without incident. Continue foot and shoe inspections daily. Monitor blood glucose per PCP/Endocrinologist's recommendations.Continue soft, supportive shoe gear daily. Report any pedal injuries to medical professional. Call office if there are any quesitons/concerns. -Patient/POA to call should there be question/concern in the interim.  Return in about 3 months (around 01/03/2024).  Luella Sager, DPM      [facility] LOCATION: [address] [place] [address]                   Office [phone]   [facility] LOCATION: [address] [place], [place] [address] Office [phone]

## 2023-10-29 ENCOUNTER — Emergency Department
Admission: EM | Admit: 2023-10-29 | Discharge: 2023-10-29 | Disposition: A | Discharge status: Other Acute Inpatient Hospital

## 2023-10-29 ENCOUNTER — Other Ambulatory Visit: Payer: Self-pay

## 2023-10-29 ENCOUNTER — Emergency Department

## 2023-10-29 DIAGNOSIS — W06XXXA Fall from bed, initial encounter: Secondary | ICD-10-CM | POA: Insufficient documentation

## 2023-10-29 DIAGNOSIS — Y92239 Unspecified place in hospital as the place of occurrence of the external cause: Secondary | ICD-10-CM | POA: Diagnosis not present

## 2023-10-29 DIAGNOSIS — W19XXXA Unspecified fall, initial encounter: Secondary | ICD-10-CM

## 2023-10-29 DIAGNOSIS — E119 Type 2 diabetes mellitus without complications: Secondary | ICD-10-CM | POA: Diagnosis not present

## 2023-10-29 DIAGNOSIS — J449 Chronic obstructive pulmonary disease, unspecified: Secondary | ICD-10-CM | POA: Diagnosis not present

## 2023-10-29 DIAGNOSIS — I10 Essential (primary) hypertension: Secondary | ICD-10-CM | POA: Diagnosis not present

## 2023-10-29 DIAGNOSIS — Z043 Encounter for examination and observation following other accident: Secondary | ICD-10-CM | POA: Diagnosis present

## 2023-10-29 DIAGNOSIS — D496 Neoplasm of unspecified behavior of brain: Secondary | ICD-10-CM | POA: Diagnosis not present

## 2023-10-29 DIAGNOSIS — E871 Hypo-osmolality and hyponatremia: Secondary | ICD-10-CM | POA: Diagnosis not present

## 2023-10-29 DIAGNOSIS — D72829 Elevated white blood cell count, unspecified: Secondary | ICD-10-CM | POA: Insufficient documentation

## 2023-10-29 DIAGNOSIS — Z515 Encounter for palliative care: Secondary | ICD-10-CM

## 2023-10-29 LAB — CBC WITH DIFFERENTIAL/PLATELET
Abs Immature Granulocytes: 0.13 10*3/uL — ABNORMAL HIGH (ref 0.00–0.07)
Basophils Absolute: 0.1 10*3/uL (ref 0.0–0.1)
Basophils Relative: 1 %
Eosinophils Absolute: 0.1 10*3/uL (ref 0.0–0.5)
Eosinophils Relative: 1 %
HCT: 45.4 % (ref 39.0–52.0)
Hemoglobin: 14.7 g/dL (ref 13.0–17.0)
Immature Granulocytes: 1 %
Lymphocytes Relative: 10 %
Lymphs Abs: 1.2 10*3/uL (ref 0.7–4.0)
MCH: 30.5 pg (ref 26.0–34.0)
MCHC: 32.4 g/dL (ref 30.0–36.0)
MCV: 94.2 fL (ref 80.0–100.0)
Monocytes Absolute: 1.4 10*3/uL — ABNORMAL HIGH (ref 0.1–1.0)
Monocytes Relative: 12 %
Neutro Abs: 8.9 10*3/uL — ABNORMAL HIGH (ref 1.7–7.7)
Neutrophils Relative %: 75 %
Platelets: 139 10*3/uL — ABNORMAL LOW (ref 150–400)
RBC: 4.82 MIL/uL (ref 4.22–5.81)
RDW: 14.3 % (ref 11.5–15.5)
WBC: 11.8 10*3/uL — ABNORMAL HIGH (ref 4.0–10.5)
nRBC: 0 % (ref 0.0–0.2)

## 2023-10-29 LAB — BASIC METABOLIC PANEL WITH GFR
Anion gap: 12 (ref 5–15)
BUN: 45 mg/dL — ABNORMAL HIGH (ref 8–23)
CO2: 25 mmol/L (ref 22–32)
Calcium: 9.7 mg/dL (ref 8.9–10.3)
Chloride: 109 mmol/L (ref 98–111)
Creatinine, Ser: 1.81 mg/dL — ABNORMAL HIGH (ref 0.61–1.24)
GFR, Estimated: 35 mL/min — ABNORMAL LOW (ref >60)
Glucose, Bld: 159 mg/dL — ABNORMAL HIGH (ref 70–99)
Potassium: 3.7 mmol/L (ref 3.5–5.1)
Sodium: 146 mmol/L — ABNORMAL HIGH (ref 135–145)

## 2023-10-29 LAB — CK: Total CK: 23 U/L — ABNORMAL LOW (ref 49–397)

## 2023-10-29 MED ORDER — SODIUM CHLORIDE 0.9 % IV BOLUS
500.0000 mL | Freq: Once | INTRAVENOUS | Status: AC
  Administered 2023-10-29: 500 mL via INTRAVENOUS

## 2023-10-29 NOTE — ED Notes (Signed)
 Pt bed wet from water given earlier in the day. Pt clothing taken off and gathered into pt belongings bag. New sheet, chux, and brief placed. Pt fall bundle intact. Pt resting comfortably.

## 2023-10-29 NOTE — ED Provider Notes (Addendum)
 [facility] Provider Note    Event Date/Time   First MD Initiated Contact with Patient 10/29/23 [phone]     (approximate)   History   Fall  Pt in from home for a fall out of bed. No LOC. Pt not on blood thinners. Fall unwitnessed and pt does not remember fall. Pt is on hospice care for a brain tumor.  HR-115 96% on Rm 154/94    HPI Jared Parker is a 89 y.o. male PMH T2DM, COPD, hypertension, hyperlipidemia, brain tumor currently on hospice care presents for evaluation from fall from bed -Unwitnessed, unclear if head strike or LOC.  Lives with family.  Ambulance called, asked family to contact hospice provider, hospice reportedly did not answer so they requested patient be evaluated in the emergency department. -On my evaluation, patient has no complaints.  Denies any pain.  Does not remember falling.  Attempted to gather collateral using contact information in chart for son, states numbers not in service.     Physical Exam   Triage Vital Signs: ED Triage Vitals [10/29/23 0736]  Encounter Vitals Group     BP (!) 150/89     Girls Systolic BP Percentile      Girls Diastolic BP Percentile      Boys Systolic BP Percentile      Boys Diastolic BP Percentile      Pulse Rate (!) 115     Resp 18     Temp 97.6 F (36.4 C)     Temp Source Oral     SpO2 98 %     Weight      Height      Head Circumference      Peak Flow      Pain Score      Pain Loc      Pain Education      Exclude from Growth Chart     Most recent vital signs: Vitals:   10/29/23 1540 10/29/23 1541  BP: (!) 141/82 (!) 141/82  Pulse: 96 96  Resp: 16 16  Temp: 97.6 F (36.4 C) 97.6 F (36.4 C)  SpO2: 96%      General: Awake, no distress.  HEENT: Normocephalic, atraumatic, PERRL CV:  Good peripheral perfusion. RRR, RP 2+ Resp:  Normal effort. CTAB Abd:  No distention. Nontender to deep palpation throughout Neuro:  Alert, unclear orientation, does respond to basic  questions appropriately, moving all extremities spontaneously Other:  No midline back pain or neck pain, no tenderness or deformities throughout bilateral upper and lower extremities   ED Results / Procedures / Treatments   Labs (all labs ordered are listed, but only abnormal results are displayed) Labs Reviewed  CBC WITH DIFFERENTIAL/PLATELET - Abnormal; Notable for the following components:      Result Value   WBC 11.8 (*)    Platelets 139 (*)    Neutro Abs 8.9 (*)    Monocytes Absolute 1.4 (*)    Abs Immature Granulocytes 0.13 (*)    All other components within normal limits  BASIC METABOLIC PANEL WITH GFR - Abnormal; Notable for the following components:   Sodium 146 (*)    Glucose, Bld 159 (*)    BUN 45 (*)    Creatinine, Ser 1.81 (*)    GFR, Estimated 35 (*)    All other components within normal limits  CK - Abnormal; Notable for the following components:   Total CK 23 (*)    All other components within  normal limits     EKG  N/a   RADIOLOGY Radiology interpreted by myself and radiology report reviewed.  Redemonstration of known intracranial mass with some surrounding edema and midline shift, slightly worsened from prior imaging.  No traumatic injuries.    PROCEDURES:  Critical Care performed: No  Procedures   MEDICATIONS ORDERED IN ED: Medications  sodium chloride  0.9 % bolus 500 mL (0 mLs Intravenous Stopped 10/29/23 1057)     IMPRESSION / MDM / ASSESSMENT AND PLAN / ED COURSE  I reviewed the triage vital signs and the nursing notes.                              DDX/MDM/AP: Differential diagnosis includes, but is not limited to, simple fall from bed, consider subsequent skull fracture or intracranial hemorrhage, considered but doubt C-spine injury.  Based on exam doubt rib fractures, pneumothorax, pelvic fracture given age will screen with x-rays.  No evidence of extremity injuries.  Some mild tachycardia here of unclear etiology, will get basic labs  and give small bolus IV fluid.  No clear evidence of underlying infection at this time.  Plan: - CT head, C-spine - Chest x-ray, x-ray pelvis - Basic labs - N.p.o. - Small bolus IV fluid - Reassess  Patient's presentation is most consistent with acute presentation with potential threat to life or bodily function.  The patient is on the cardiac monitor to evaluate for evidence of arrhythmia and/or significant heart rate changes.  ED course below.  Workup unremarkable in emergency department Redemonstration of known mass with some slightly worsening vasogenic edema and midline shift.  Patient asymptomatic here but appears he has been having worsening behavioral issues and frequent falls at home per family who is now bedside.  Discussed with patient's palliative care / hospice care and hospice provider did note recommend escalation to inpatient hospice--transferred to inpatient hospice service, appreciate their assistance.  Clinical Course as of 10/29/23 1616  Sat Oct 29, 2023  0833 BMP reviewed, overall unremarkable, very mild hyponatremia  CBC with very mild leukocytosis, nonspecific  CK unremarkable [MM]  0834 Chest x-ray reviewed, no acute pathology on my interpretation, radiology report reviewed [MM]  0855 CTH: IMPRESSION: 1. Slightly enlarged posterior left temporal and occipital lobe tumor compared to prior study, now measuring up to 4.5 cm, with increased mass effect, surrounding vasogenic edema, further effacement of the sulci, and 6 mm midline shift.   [MM]  0855 CTCspine: IMPRESSION: 1. No acute abnormality of the cervical spine related to the reported trauma. 2. Mild straightening of the normal cervical lordosis. 3. Multilevel degenerative changes of the cervical spine as described.   [MM]  1041 Spoke w/ Mandy of hospice team - inpatient hospice at [facility] - will talk with her supervisor to see if placement here is possible. Will call me back shortly [MM]   1102 Call from [facility], states supervisor will call me shortly to discuss possible transfer to inpatient hospice [MM]  1114 Spoke Darrelyn Ely, hospice fellow at [facility] - Will discuss with attending and reach back to me shortly.  Patient may be eligible for inpatient hospice. [MM]  1412 Patient excepted to [facility] hospice Accepting Dr. Amedeo Jupiter [MM]    Clinical Course User Index [MM] Collis Deaner, MD     FINAL CLINICAL IMPRESSION(S) / ED DIAGNOSES   Final diagnoses:  Fall, initial encounter  Hospice care  Brain tumor [facility])     Rx /  DC Orders   ED Discharge Orders     None        Note:  This document was prepared using Dragon voice recognition software and may include unintentional dictation errors.   Collis Deaner, MD 10/29/23 1616    Collis Deaner, MD 10/29/23 [phone]

## 2023-10-29 NOTE — ED Notes (Addendum)
 Received a call from [facility] copy at [facility] center. Pt accepted to [facility] hospice.

## 2023-10-29 NOTE — ED Triage Notes (Signed)
 Pt in from home for a fall out of bed. No LOC. Pt not on blood thinners. Fall unwitnessed and pt does not remember fall. Pt is on hospice care for a brain tumor.  HR-115 96% on Rm 154/94

## 2023-11-15 DEATH — deceased
